# Patient Record
Sex: Male | Born: 1955 | Race: White | Hispanic: No | Marital: Single | Smoking: Never smoker
Health system: Southern US, Community
[De-identification: ages and names within clinical notes are randomized; demographics above are authoritative.]

## PROBLEM LIST (undated history)

## (undated) DIAGNOSIS — I219 Acute myocardial infarction, unspecified: Secondary | ICD-10-CM

## (undated) DIAGNOSIS — I10 Essential (primary) hypertension: Secondary | ICD-10-CM

## (undated) DIAGNOSIS — M1990 Unspecified osteoarthritis, unspecified site: Secondary | ICD-10-CM

## (undated) HISTORY — PX: JOINT REPLACEMENT: SHX530

---

## 2016-06-08 DIAGNOSIS — I219 Acute myocardial infarction, unspecified: Secondary | ICD-10-CM

## 2016-06-08 HISTORY — DX: Acute myocardial infarction, unspecified: I21.9

## 2016-06-11 HISTORY — PX: CARDIAC CATHETERIZATION: SHX172

## 2016-07-09 ENCOUNTER — Inpatient Hospital Stay
Admission: RE | Admit: 2016-07-09 | Discharge: 2016-08-12 | Disposition: A | Discharge status: Skilled Nursing Facility | Payer: Managed Care, Other (non HMO) | Attending: Internal Medicine | Admitting: Internal Medicine

## 2016-07-09 DIAGNOSIS — Z452 Encounter for adjustment and management of vascular access device: Secondary | ICD-10-CM

## 2016-07-09 DIAGNOSIS — N179 Acute kidney failure, unspecified: Secondary | ICD-10-CM

## 2016-07-09 DIAGNOSIS — N189 Chronic kidney disease, unspecified: Principal | ICD-10-CM

## 2016-07-10 LAB — COMPREHENSIVE METABOLIC PANEL
ALT: 17 U/L (ref 17–63)
AST: 30 U/L (ref 15–41)
Albumin: 1.8 g/dL — ABNORMAL LOW (ref 3.5–5.0)
Alkaline Phosphatase: 89 U/L (ref 38–126)
Anion gap: 10 (ref 5–15)
BUN: 57 mg/dL — AB (ref 6–20)
CHLORIDE: 114 mmol/L — AB (ref 101–111)
CO2: 16 mmol/L — AB (ref 22–32)
CREATININE: 3.65 mg/dL — AB (ref 0.61–1.24)
Calcium: 8 mg/dL — ABNORMAL LOW (ref 8.9–10.3)
GFR calc Af Amer: 20 mL/min — ABNORMAL LOW (ref >60)
GFR calc non Af Amer: 17 mL/min — ABNORMAL LOW (ref >60)
GLUCOSE: 108 mg/dL — AB (ref 65–99)
Potassium: 4.6 mmol/L (ref 3.5–5.1)
SODIUM: 140 mmol/L (ref 135–145)
Total Bilirubin: 0.5 mg/dL (ref 0.3–1.2)
Total Protein: 5.8 g/dL — ABNORMAL LOW (ref 6.5–8.1)

## 2016-07-10 LAB — PROTIME-INR
INR: 1.49
Prothrombin Time: 18.1 seconds — ABNORMAL HIGH (ref 11.4–15.2)

## 2016-07-10 LAB — CBC
HCT: 32.2 % — ABNORMAL LOW (ref 39.0–52.0)
Hemoglobin: 9.7 g/dL — ABNORMAL LOW (ref 13.0–17.0)
MCH: 26.9 pg (ref 26.0–34.0)
MCHC: 30.1 g/dL (ref 30.0–36.0)
MCV: 89.4 fL (ref 78.0–100.0)
PLATELETS: 269 10*3/uL (ref 150–400)
RBC: 3.6 MIL/uL — ABNORMAL LOW (ref 4.22–5.81)
RDW: 16.3 % — AB (ref 11.5–15.5)
WBC: 10 10*3/uL (ref 4.0–10.5)

## 2016-07-15 LAB — CBC
HCT: 36.3 % — ABNORMAL LOW (ref 39.0–52.0)
Hemoglobin: 10.9 g/dL — ABNORMAL LOW (ref 13.0–17.0)
MCH: 26.9 pg (ref 26.0–34.0)
MCHC: 30 g/dL (ref 30.0–36.0)
MCV: 89.6 fL (ref 78.0–100.0)
PLATELETS: 227 10*3/uL (ref 150–400)
RBC: 4.05 MIL/uL — ABNORMAL LOW (ref 4.22–5.81)
RDW: 17.6 % — AB (ref 11.5–15.5)
WBC: 8 10*3/uL (ref 4.0–10.5)

## 2016-07-15 LAB — BASIC METABOLIC PANEL
ANION GAP: 14 (ref 5–15)
BUN: 82 mg/dL — ABNORMAL HIGH (ref 6–20)
CALCIUM: 8.4 mg/dL — AB (ref 8.9–10.3)
CO2: 14 mmol/L — ABNORMAL LOW (ref 22–32)
Chloride: 109 mmol/L (ref 101–111)
Creatinine, Ser: 6.15 mg/dL — ABNORMAL HIGH (ref 0.61–1.24)
GFR, EST AFRICAN AMERICAN: 10 mL/min — AB (ref >60)
GFR, EST NON AFRICAN AMERICAN: 9 mL/min — AB (ref >60)
Glucose, Bld: 111 mg/dL — ABNORMAL HIGH (ref 65–99)
Potassium: 6 mmol/L — ABNORMAL HIGH (ref 3.5–5.1)
Sodium: 137 mmol/L (ref 135–145)

## 2016-07-15 LAB — POTASSIUM: Potassium: 5.8 mmol/L — ABNORMAL HIGH (ref 3.5–5.1)

## 2016-07-16 ENCOUNTER — Institutional Professional Consult (permissible substitution) (HOSPITAL_COMMUNITY): Payer: Managed Care, Other (non HMO)

## 2016-07-16 LAB — URINALYSIS, ROUTINE W REFLEX MICROSCOPIC
BILIRUBIN URINE: NEGATIVE
Glucose, UA: NEGATIVE mg/dL
Ketones, ur: NEGATIVE mg/dL
Nitrite: NEGATIVE
PH: 5 (ref 5.0–8.0)
Protein, ur: 100 mg/dL — AB
Specific Gravity, Urine: 1.013 (ref 1.005–1.030)
Squamous Epithelial / LPF: NONE SEEN

## 2016-07-16 LAB — BASIC METABOLIC PANEL
ANION GAP: 9 (ref 5–15)
BUN: 42 mg/dL — ABNORMAL HIGH (ref 6–20)
CO2: 18 mmol/L — ABNORMAL LOW (ref 22–32)
Calcium: 7.9 mg/dL — ABNORMAL LOW (ref 8.9–10.3)
Chloride: 113 mmol/L — ABNORMAL HIGH (ref 101–111)
Creatinine, Ser: 2.07 mg/dL — ABNORMAL HIGH (ref 0.61–1.24)
GFR calc Af Amer: 39 mL/min — ABNORMAL LOW (ref >60)
GFR calc non Af Amer: 34 mL/min — ABNORMAL LOW (ref >60)
Glucose, Bld: 113 mg/dL — ABNORMAL HIGH (ref 65–99)
POTASSIUM: 4.1 mmol/L (ref 3.5–5.1)
SODIUM: 140 mmol/L (ref 135–145)

## 2016-07-16 NOTE — Consult Note (Signed)
[facility] CONSULT NOTE    Date: 07/16/2016                  Patient Name:  Wayne Phelps  MRN: [id number]  DOB: 11-19-1955  Age / Sex: 61 y.o., male         PCP: System, Pcp Not In                 Service Requesting Consult: Hospitalist                 Reason for Consult: Acute renal failure            History of Present Illness: Patient is a 61 y.o. male with a PMHx of morbid obesity,hypertension, and depression, who was admitted to [facility] on 07/09/2016 for ongoing care of generalized weakness, acute renal failure, recent NSTEMI status post PCI 06/13/2016, femoral arteriotomy injury with dehiscence.  Patient was admitted to no one helped from 06/13/2016-07/09/2016.  He originally presented with chest pain. He underwent drug-eluting stent placement to the right coronary artery on 06/13/2016. Subsequently he developed hemorrhagic shock and femoral arteriotomy injury with wound dehiscence. He also had acute hypoxic respiratory failure and was ultimately extubated on 06/25/2016. He then had to be reintubated on 06/29/2016 and was then again next visit on 07/01/2016. During this hospitalization which is likely multifactorial with contributions from hemorrhagic shock and contrastadministration.  Upon presentation here the patient's creatinine was 3.65. This was rechecked on 07/15/16 and BUN is at 82 with a creatinine of 6.15. Foley catheter was subsequently placed n.p.o. And is currently down to 42 with a creatinine of 2.07. Serum potassium also down to 4.1.  Medications: Current medications: Humalog insulin, amiodarone 600 mg daily, aspirin 81 mg daily, atorvastatin 40 mg each bedtime, brilinta 90mg  BID, famotidine 20 mg twice a day, hydralazine 50 mg daily, metoclopramide 5 mg 2 a.c., metoprolol 25 mg twice a day, MiraLax 17 g daily, protein supplement 30 cc twice a day, multivitamin 1 tablet daily   Allergies: No known drug allergies   Past Medical  History: Depression    Edema    Benign essential HTN    Morbid obesity (*)    CAD (coronary artery disease)    Common femoral artery injury       Past Surgical History: left total shoulder     SHOULDER SURGERY  Left   CORONARY ANGIOPLASTY WITH STENT PLACEMENT 06/13/2016    FEMORAL ARTERY REPAIR 06/13/2016 Right Repair CRA Interpositional Graft Dr Carmine Savoy   Medical History    Family History: Patient denies family history of  Social History: Lives alone. Denies tobacco, alcohol, or illicit drug use. Works in [employer], academic.   Review of Systems: As per HPI  Vital Signs: Temperature 90.8 pulse 70 respirations 20 blood pressure 131/75 Weight trends: There were no vitals filed for this visit.  Physical Exam: General: Obese male, no acute distress  Head: Normocephalic, atraumatic.  Eyes: Anicteric, EOMI  Nose: Mucous membranes moist, not inflammed, nonerythematous.  Throat: Oropharynx nonerythematous, no exudate appreciated.   Neck: Supple, trachea midline.  Lungs:  Normal respiratory effort. Clear to auscultation BL without crackles or wheezes.  Heart: S1S2 no rubs, irregular at times  Abdomen:  BS normoactive. Soft, Nondistended, non-tender.  No masses or organomegaly.  Extremities: 1+ b/l LE edema  Neurologic: A&O X3, Motor strength is 5/5 in the all 4 extremities  Skin: No visible rashes, scars.    Lab results: Basic Metabolic Panel:  Recent Labs Lab 07/10/16 0551 07/15/16 0623 07/15/16 1408 07/16/16 1225  NA 140 137  --  140  K 4.6 6.0* 5.8* 4.1  CL 114* 109  --  113*  CO2 16* 14*  --  18*  GLUCOSE 108* 111*  --  113*  BUN 57* 82*  --  42*  CREATININE 3.65* 6.15*  --  2.07*  CALCIUM 8.0* 8.4*  --  7.9*    Liver Function Tests:  Recent Labs Lab 07/10/16 0551  AST 30  ALT 17  ALKPHOS 89  BILITOT 0.5  PROT 5.8*  ALBUMIN 1.8*   No results for input(s): LIPASE, AMYLASE in the last 168 hours. No results for input(s): AMMONIA  in the last 168 hours.  CBC:  Recent Labs Lab 07/10/16 0551 07/15/16 0623  WBC 10.0 8.0  HGB 9.7* 10.9*  HCT 32.2* 36.3*  MCV 89.4 89.6  PLT 269 227    Cardiac Enzymes: No results for input(s): CKTOTAL, CKMB, CKMBINDEX, TROPONINI in the last 168 hours.  BNP: Invalid input(s): POCBNP  CBG: No results for input(s): GLUCAP in the last 168 hours.  Microbiology: No results found for this or any previous visit.  Coagulation Studies: No results for input(s): LABPROT, INR in the last 72 hours.  Urinalysis: No results for input(s): COLORURINE, LABSPEC, PHURINE, GLUCOSEU, HGBUR, BILIRUBINUR, KETONESUR, PROTEINUR, UROBILINOGEN, NITRITE, LEUKOCYTESUR in the last 72 hours.  Invalid input(s): APPERANCEUR    Imaging:  No results found.   Assessment & Plan: Pt is a 61 y.o. male with a PMHx of morbid obesity,hypertension, depression, who was admitted to [facility] on 07/09/2016 for ongoing care of generalized weakness, acute renal failure, recent NSTEMI status post PCI 06/13/2016 s/p RCA stent placement, femoral arteriotomy injury with dehiscence.   1.  Acute renal failure. 2.  Urinary retention. 3.  Hyperkalemia, K down to 4.1. 4.  Metabolic acidosis.  Plan:  He patient had recent acute renal failure noted at [facility].  Yesterday the patient's creatinine was noted to be markedly elevated at 6.15 with a potassium of 6.0. Foley catheter was placed and he had 3 L of urine output in a fairly short period of time. Therefore it appears he has some element of urinary retention. We will obtain renal ultrasound to further evaluate. Recommend keeping the Foley catheter in place for now. BUN currently down to 42 and creatinine is down to 2.07. Potassium has improved to 4.1. Otherwise recommend avoiding contrast, NSAIDs, and nephrotoxins at this time. Thanks for the consultation.

## 2016-07-17 LAB — RENAL FUNCTION PANEL
ALBUMIN: 2.1 g/dL — AB (ref 3.5–5.0)
Anion gap: 8 (ref 5–15)
BUN: 30 mg/dL — AB (ref 6–20)
CALCIUM: 8.1 mg/dL — AB (ref 8.9–10.3)
CO2: 21 mmol/L — AB (ref 22–32)
CREATININE: 1.35 mg/dL — AB (ref 0.61–1.24)
Chloride: 112 mmol/L — ABNORMAL HIGH (ref 101–111)
GFR calc Af Amer: >60 mL/min (ref >60)
GFR calc non Af Amer: 56 mL/min — ABNORMAL LOW (ref >60)
GLUCOSE: 102 mg/dL — AB (ref 65–99)
PHOSPHORUS: 2.7 mg/dL (ref 2.5–4.6)
Potassium: 4.1 mmol/L (ref 3.5–5.1)
SODIUM: 141 mmol/L (ref 135–145)

## 2016-07-18 NOTE — Progress Notes (Signed)
[facility]  ROUNDING NOTE   Subjective:  Patient doing much better. BUN down to 30 and creatinine of 1.3. Kidney function significantly improved with Foley drainage.   Objective:  Vital signs in last 24 hours:  Temperature 98.4 pulse 67 respirations 19 blood pressure 141/63 Physical Exam: General: No acute distress, morbidly obese  Head: Normocephalic, atraumatic. Moist oral mucosal membranes  Eyes: Anicteric  Neck: Supple, trachea midline  Lungs:  Clear to auscultation, normal effort  Heart: S1S2 no rubs  Abdomen:  Soft, nontender, bowel sounds present  Extremities: Trace peripheral edema.  Neurologic: Awake, alert, following commands  Skin: No lesions       Basic Metabolic Panel:  Recent Labs Lab 07/15/16 0623 07/15/16 1408 07/16/16 1225 07/17/16 0458  NA 137  --  140 141  K 6.0* 5.8* 4.1 4.1  CL 109  --  113* 112*  CO2 14*  --  18* 21*  GLUCOSE 111*  --  113* 102*  BUN 82*  --  42* 30*  CREATININE 6.15*  --  2.07* 1.35*  CALCIUM 8.4*  --  7.9* 8.1*  PHOS  --   --   --  2.7    Liver Function Tests:  Recent Labs Lab 07/17/16 0458  ALBUMIN 2.1*   No results for input(s): LIPASE, AMYLASE in the last 168 hours. No results for input(s): AMMONIA in the last 168 hours.  CBC:  Recent Labs Lab 07/15/16 0623  WBC 8.0  HGB 10.9*  HCT 36.3*  MCV 89.6  PLT 227    Cardiac Enzymes: No results for input(s): CKTOTAL, CKMB, CKMBINDEX, TROPONINI in the last 168 hours.  BNP: Invalid input(s): POCBNP  CBG: No results for input(s): GLUCAP in the last 168 hours.  Microbiology: No results found for this or any previous visit.  Coagulation Studies: No results for input(s): LABPROT, INR in the last 72 hours.  Urinalysis:  Recent Labs  07/16/16 1512  COLORURINE YELLOW  LABSPEC 1.013  PHURINE 5.0  GLUCOSEU NEGATIVE  HGBUR LARGE*  BILIRUBINUR NEGATIVE  KETONESUR NEGATIVE  PROTEINUR 100*  NITRITE NEGATIVE  LEUKOCYTESUR LARGE*       Imaging: US Renal  Result Date: 07/16/2016 CLINICAL DATA:  Acute onset of renal failure.  Initial encounter. EXAM: RENAL / URINARY TRACT ULTRASOUND COMPLETE COMPARISON:  None. FINDINGS: Right Kidney: Length: 11.6 cm. Echogenicity within normal limits. No mass or hydronephrosis visualized. Left Kidney: Length: 12.5 cm. Echogenicity within normal limits. No mass or hydronephrosis visualized. Bladder: Decompressed and not visualized on this study. IMPRESSION: Unremarkable renal ultrasound. No evidence of hydronephrosis. Bladder not visualized. Electronically Signed   By: Garald Balding M.D.   On: 07/16/2016 21:10     Medications:       Assessment/ Plan:  61 y.o. male with a PMHx of morbid obesity,hypertension, depression, who was admitted to [facility] on 07/09/2016 for ongoing care of generalized weakness, acute renal failure, recent NSTEMI status post PCI 06/13/2016 s/p RCA stent placement, femoral arteriotomy injury with dehiscence.   1.  Acute renal failure. 2.  Urinary retention. 3.  Hyperkalemia, K down to 4.1. 4.  Metabolic acidosis.  Plan:  Renal function significantly improved. BUN down to 30 and creatinine down to 1.3 with Foley drainage. Patient did haveurinary retention which could have been related to constipation. Renal ultrasound was negative for hydronephrosis. Serum potassium also significantly improved and stable at 4.1. Metabolic acidosis also improved with serum bicarbonate of 21. Recommend continue periodic monitoring of renal function.  LOS: 0 Wayne Phelps 5/11/20188:14 AM

## 2016-07-21 LAB — CBC
HEMATOCRIT: 32.4 % — AB (ref 39.0–52.0)
Hemoglobin: 10 g/dL — ABNORMAL LOW (ref 13.0–17.0)
MCH: 27.8 pg (ref 26.0–34.0)
MCHC: 30.9 g/dL (ref 30.0–36.0)
MCV: 90 fL (ref 78.0–100.0)
Platelets: 181 10*3/uL (ref 150–400)
RBC: 3.6 MIL/uL — AB (ref 4.22–5.81)
RDW: 17.2 % — ABNORMAL HIGH (ref 11.5–15.5)
WBC: 8.5 10*3/uL (ref 4.0–10.5)

## 2016-07-21 LAB — BASIC METABOLIC PANEL
ANION GAP: 10 (ref 5–15)
BUN: 16 mg/dL (ref 6–20)
CHLORIDE: 105 mmol/L (ref 101–111)
CO2: 22 mmol/L (ref 22–32)
Calcium: 8.5 mg/dL — ABNORMAL LOW (ref 8.9–10.3)
Creatinine, Ser: 0.89 mg/dL (ref 0.61–1.24)
GFR calc Af Amer: >60 mL/min (ref >60)
GFR calc non Af Amer: >60 mL/min (ref >60)
GLUCOSE: 107 mg/dL — AB (ref 65–99)
POTASSIUM: 3.3 mmol/L — AB (ref 3.5–5.1)
Sodium: 137 mmol/L (ref 135–145)

## 2016-07-22 LAB — POTASSIUM: POTASSIUM: 3.2 mmol/L — AB (ref 3.5–5.1)

## 2016-07-23 LAB — BASIC METABOLIC PANEL
Anion gap: 9 (ref 5–15)
BUN: 20 mg/dL (ref 6–20)
CHLORIDE: 104 mmol/L (ref 101–111)
CO2: 26 mmol/L (ref 22–32)
CREATININE: 1 mg/dL (ref 0.61–1.24)
Calcium: 8.5 mg/dL — ABNORMAL LOW (ref 8.9–10.3)
GFR calc Af Amer: >60 mL/min (ref >60)
GFR calc non Af Amer: >60 mL/min (ref >60)
Glucose, Bld: 113 mg/dL — ABNORMAL HIGH (ref 65–99)
Potassium: 3.5 mmol/L (ref 3.5–5.1)
Sodium: 139 mmol/L (ref 135–145)

## 2016-07-28 LAB — BASIC METABOLIC PANEL
ANION GAP: 11 (ref 5–15)
Anion gap: 11 (ref 5–15)
BUN: 20 mg/dL (ref 6–20)
BUN: 21 mg/dL — AB (ref 6–20)
CHLORIDE: 102 mmol/L (ref 101–111)
CO2: 22 mmol/L (ref 22–32)
CO2: 22 mmol/L (ref 22–32)
CREATININE: 1.25 mg/dL — AB (ref 0.61–1.24)
Calcium: 8.3 mg/dL — ABNORMAL LOW (ref 8.9–10.3)
Calcium: 8.3 mg/dL — ABNORMAL LOW (ref 8.9–10.3)
Chloride: 104 mmol/L (ref 101–111)
Creatinine, Ser: 1.41 mg/dL — ABNORMAL HIGH (ref 0.61–1.24)
GFR calc Af Amer: >60 mL/min (ref >60)
GFR calc Af Amer: >60 mL/min (ref >60)
GFR, EST NON AFRICAN AMERICAN: 53 mL/min — AB (ref >60)
GLUCOSE: 109 mg/dL — AB (ref 65–99)
Glucose, Bld: 103 mg/dL — ABNORMAL HIGH (ref 65–99)
POTASSIUM: 3.8 mmol/L (ref 3.5–5.1)
Potassium: 3.8 mmol/L (ref 3.5–5.1)
SODIUM: 137 mmol/L (ref 135–145)
Sodium: 135 mmol/L (ref 135–145)

## 2016-07-28 LAB — CBC
HCT: 32.5 % — ABNORMAL LOW (ref 39.0–52.0)
HCT: 33.8 % — ABNORMAL LOW (ref 39.0–52.0)
HEMOGLOBIN: 10.3 g/dL — AB (ref 13.0–17.0)
Hemoglobin: 9.8 g/dL — ABNORMAL LOW (ref 13.0–17.0)
MCH: 26.3 pg (ref 26.0–34.0)
MCH: 26.5 pg (ref 26.0–34.0)
MCHC: 30.2 g/dL (ref 30.0–36.0)
MCHC: 30.5 g/dL (ref 30.0–36.0)
MCV: 87.1 fL (ref 78.0–100.0)
MCV: 86.9 fL (ref 78.0–100.0)
PLATELETS: 217 10*3/uL (ref 150–400)
PLATELETS: 218 10*3/uL (ref 150–400)
RBC: 3.73 MIL/uL — ABNORMAL LOW (ref 4.22–5.81)
RBC: 3.89 MIL/uL — AB (ref 4.22–5.81)
RDW: 16.3 % — AB (ref 11.5–15.5)
RDW: 16.3 % — ABNORMAL HIGH (ref 11.5–15.5)
WBC: 7.1 10*3/uL (ref 4.0–10.5)
WBC: 8.1 10*3/uL (ref 4.0–10.5)

## 2016-07-28 LAB — PROTIME-INR
INR: 1.2
Prothrombin Time: 15.2 seconds (ref 11.4–15.2)

## 2016-07-28 LAB — MAGNESIUM: MAGNESIUM: 1.8 mg/dL (ref 1.7–2.4)

## 2016-07-28 LAB — C DIFFICILE QUICK SCREEN W PCR REFLEX
C DIFFICLE (CDIFF) ANTIGEN: POSITIVE — AB
C Diff interpretation: DETECTED
C Diff toxin: POSITIVE — AB

## 2016-07-30 LAB — BASIC METABOLIC PANEL
Anion gap: 10 (ref 5–15)
BUN: 21 mg/dL — AB (ref 6–20)
CHLORIDE: 104 mmol/L (ref 101–111)
CO2: 20 mmol/L — AB (ref 22–32)
CREATININE: 1.58 mg/dL — AB (ref 0.61–1.24)
Calcium: 8.1 mg/dL — ABNORMAL LOW (ref 8.9–10.3)
GFR calc Af Amer: 54 mL/min — ABNORMAL LOW (ref >60)
GFR, EST NON AFRICAN AMERICAN: 46 mL/min — AB (ref >60)
GLUCOSE: 113 mg/dL — AB (ref 65–99)
POTASSIUM: 3.5 mmol/L (ref 3.5–5.1)
Sodium: 134 mmol/L — ABNORMAL LOW (ref 135–145)

## 2016-08-01 LAB — BASIC METABOLIC PANEL
Anion gap: 10 (ref 5–15)
BUN: 24 mg/dL — ABNORMAL HIGH (ref 6–20)
CALCIUM: 8.1 mg/dL — AB (ref 8.9–10.3)
CHLORIDE: 104 mmol/L (ref 101–111)
CO2: 22 mmol/L (ref 22–32)
CREATININE: 1.59 mg/dL — AB (ref 0.61–1.24)
GFR calc non Af Amer: 46 mL/min — ABNORMAL LOW (ref >60)
GFR, EST AFRICAN AMERICAN: 53 mL/min — AB (ref >60)
Glucose, Bld: 106 mg/dL — ABNORMAL HIGH (ref 65–99)
Potassium: 3.4 mmol/L — ABNORMAL LOW (ref 3.5–5.1)
SODIUM: 136 mmol/L (ref 135–145)

## 2016-08-01 LAB — CBC
HCT: 27.8 % — ABNORMAL LOW (ref 39.0–52.0)
Hemoglobin: 8.6 g/dL — ABNORMAL LOW (ref 13.0–17.0)
MCH: 26.3 pg (ref 26.0–34.0)
MCHC: 30.9 g/dL (ref 30.0–36.0)
MCV: 85 fL (ref 78.0–100.0)
PLATELETS: 275 10*3/uL (ref 150–400)
RBC: 3.27 MIL/uL — ABNORMAL LOW (ref 4.22–5.81)
RDW: 16.8 % — AB (ref 11.5–15.5)
WBC: 11.3 10*3/uL — ABNORMAL HIGH (ref 4.0–10.5)

## 2016-08-02 LAB — POTASSIUM: POTASSIUM: 3.6 mmol/L (ref 3.5–5.1)

## 2016-08-03 LAB — BASIC METABOLIC PANEL
Anion gap: 10 (ref 5–15)
BUN: 31 mg/dL — AB (ref 6–20)
CALCIUM: 7.9 mg/dL — AB (ref 8.9–10.3)
CO2: 20 mmol/L — ABNORMAL LOW (ref 22–32)
CREATININE: 1.86 mg/dL — AB (ref 0.61–1.24)
Chloride: 105 mmol/L (ref 101–111)
GFR calc Af Amer: 44 mL/min — ABNORMAL LOW (ref >60)
GFR calc non Af Amer: 38 mL/min — ABNORMAL LOW (ref >60)
GLUCOSE: 100 mg/dL — AB (ref 65–99)
Potassium: 3.7 mmol/L (ref 3.5–5.1)
Sodium: 135 mmol/L (ref 135–145)

## 2016-08-03 LAB — CBC WITH DIFFERENTIAL/PLATELET
BASOS PCT: 0 %
Basophils Absolute: 0 10*3/uL (ref 0.0–0.1)
EOS ABS: 0.2 10*3/uL (ref 0.0–0.7)
Eosinophils Relative: 2 %
HCT: 27.2 % — ABNORMAL LOW (ref 39.0–52.0)
Hemoglobin: 8.4 g/dL — ABNORMAL LOW (ref 13.0–17.0)
LYMPHS ABS: 0.9 10*3/uL (ref 0.7–4.0)
LYMPHS PCT: 8 %
MCH: 26.5 pg (ref 26.0–34.0)
MCHC: 30.9 g/dL (ref 30.0–36.0)
MCV: 85.8 fL (ref 78.0–100.0)
MONO ABS: 0.6 10*3/uL (ref 0.1–1.0)
Monocytes Relative: 5 %
NEUTROS ABS: 9.5 10*3/uL — AB (ref 1.7–7.7)
Neutrophils Relative %: 85 %
Platelets: 320 10*3/uL (ref 150–400)
RBC: 3.17 MIL/uL — ABNORMAL LOW (ref 4.22–5.81)
RDW: 17.4 % — ABNORMAL HIGH (ref 11.5–15.5)
WBC: 11.2 10*3/uL — ABNORMAL HIGH (ref 4.0–10.5)

## 2016-08-03 LAB — URINALYSIS, ROUTINE W REFLEX MICROSCOPIC
GLUCOSE, UA: 100 mg/dL — AB
Ketones, ur: 15 mg/dL — AB
Nitrite: POSITIVE — AB
SPECIFIC GRAVITY, URINE: 1.01 (ref 1.005–1.030)
pH: 7 (ref 5.0–8.0)

## 2016-08-03 LAB — URINALYSIS, MICROSCOPIC (REFLEX): SQUAMOUS EPITHELIAL / LPF: NONE SEEN

## 2016-08-05 ENCOUNTER — Other Ambulatory Visit (HOSPITAL_COMMUNITY): Payer: Managed Care, Other (non HMO)

## 2016-08-05 LAB — URINE CULTURE: Culture: >=100000 — AB

## 2016-08-07 LAB — BASIC METABOLIC PANEL
Anion gap: 7 (ref 5–15)
BUN: 35 mg/dL — AB (ref 6–20)
CHLORIDE: 105 mmol/L (ref 101–111)
CO2: 25 mmol/L (ref 22–32)
CREATININE: 1.9 mg/dL — AB (ref 0.61–1.24)
Calcium: 8 mg/dL — ABNORMAL LOW (ref 8.9–10.3)
GFR calc non Af Amer: 37 mL/min — ABNORMAL LOW (ref >60)
GFR, EST AFRICAN AMERICAN: 43 mL/min — AB (ref >60)
GLUCOSE: 104 mg/dL — AB (ref 65–99)
POTASSIUM: 4.1 mmol/L (ref 3.5–5.1)
Sodium: 137 mmol/L (ref 135–145)

## 2016-08-10 LAB — BASIC METABOLIC PANEL
ANION GAP: 7 (ref 5–15)
BUN: 30 mg/dL — ABNORMAL HIGH (ref 6–20)
CALCIUM: 8.4 mg/dL — AB (ref 8.9–10.3)
CO2: 26 mmol/L (ref 22–32)
CREATININE: 1.38 mg/dL — AB (ref 0.61–1.24)
Chloride: 104 mmol/L (ref 101–111)
GFR calc non Af Amer: 54 mL/min — ABNORMAL LOW (ref >60)
Glucose, Bld: 104 mg/dL — ABNORMAL HIGH (ref 65–99)
Potassium: 4.3 mmol/L (ref 3.5–5.1)
SODIUM: 137 mmol/L (ref 135–145)

## 2016-09-03 ENCOUNTER — Inpatient Hospital Stay (HOSPITAL_COMMUNITY)
Admission: EM | Admit: 2016-09-03 | Discharge: 2016-09-09 | DRG: 378 | Disposition: A | Discharge status: Home / Self Care | Payer: Managed Care, Other (non HMO) | Attending: Internal Medicine | Admitting: Internal Medicine

## 2016-09-03 DIAGNOSIS — R339 Retention of urine, unspecified: Secondary | ICD-10-CM | POA: Diagnosis not present

## 2016-09-03 DIAGNOSIS — R710 Precipitous drop in hematocrit: Secondary | ICD-10-CM

## 2016-09-03 DIAGNOSIS — Z96612 Presence of left artificial shoulder joint: Secondary | ICD-10-CM | POA: Diagnosis present

## 2016-09-03 DIAGNOSIS — Z951 Presence of aortocoronary bypass graft: Secondary | ICD-10-CM

## 2016-09-03 DIAGNOSIS — K625 Hemorrhage of anus and rectum: Secondary | ICD-10-CM | POA: Diagnosis not present

## 2016-09-03 DIAGNOSIS — I252 Old myocardial infarction: Secondary | ICD-10-CM

## 2016-09-03 DIAGNOSIS — D649 Anemia, unspecified: Secondary | ICD-10-CM | POA: Diagnosis present

## 2016-09-03 DIAGNOSIS — K921 Melena: Secondary | ICD-10-CM | POA: Diagnosis not present

## 2016-09-03 DIAGNOSIS — F329 Major depressive disorder, single episode, unspecified: Secondary | ICD-10-CM | POA: Diagnosis present

## 2016-09-03 DIAGNOSIS — Z960 Presence of urogenital implants: Secondary | ICD-10-CM | POA: Diagnosis present

## 2016-09-03 DIAGNOSIS — Z7902 Long term (current) use of antithrombotics/antiplatelets: Secondary | ICD-10-CM

## 2016-09-03 DIAGNOSIS — T45525A Adverse effect of antithrombotic drugs, initial encounter: Secondary | ICD-10-CM | POA: Diagnosis present

## 2016-09-03 DIAGNOSIS — I10 Essential (primary) hypertension: Secondary | ICD-10-CM | POA: Diagnosis present

## 2016-09-03 DIAGNOSIS — E785 Hyperlipidemia, unspecified: Secondary | ICD-10-CM | POA: Diagnosis present

## 2016-09-03 DIAGNOSIS — Z6841 Body Mass Index (BMI) 40.0 and over, adult: Secondary | ICD-10-CM

## 2016-09-03 DIAGNOSIS — Z7982 Long term (current) use of aspirin: Secondary | ICD-10-CM

## 2016-09-03 DIAGNOSIS — D125 Benign neoplasm of sigmoid colon: Secondary | ICD-10-CM

## 2016-09-03 DIAGNOSIS — K5730 Diverticulosis of large intestine without perforation or abscess without bleeding: Secondary | ICD-10-CM

## 2016-09-03 DIAGNOSIS — K649 Unspecified hemorrhoids: Secondary | ICD-10-CM

## 2016-09-03 DIAGNOSIS — R195 Other fecal abnormalities: Secondary | ICD-10-CM

## 2016-09-03 DIAGNOSIS — Z955 Presence of coronary angioplasty implant and graft: Secondary | ICD-10-CM

## 2016-09-03 DIAGNOSIS — Z79899 Other long term (current) drug therapy: Secondary | ICD-10-CM

## 2016-09-03 HISTORY — DX: Unspecified osteoarthritis, unspecified site: M19.90

## 2016-09-03 HISTORY — DX: Acute myocardial infarction, unspecified: I21.9

## 2016-09-03 HISTORY — DX: Essential (primary) hypertension: I10

## 2016-09-03 LAB — CBC WITH DIFFERENTIAL/PLATELET
Basophils Absolute: 0 10*3/uL (ref 0.0–0.1)
Basophils Relative: 0 %
EOS PCT: 7 %
Eosinophils Absolute: 0.4 10*3/uL (ref 0.0–0.7)
HCT: 32.5 % — ABNORMAL LOW (ref 39.0–52.0)
Hemoglobin: 10 g/dL — ABNORMAL LOW (ref 13.0–17.0)
LYMPHS ABS: 1.3 10*3/uL (ref 0.7–4.0)
LYMPHS PCT: 19 %
MCH: 26.1 pg (ref 26.0–34.0)
MCHC: 30.8 g/dL (ref 30.0–36.0)
MCV: 84.9 fL (ref 78.0–100.0)
MONO ABS: 0.5 10*3/uL (ref 0.1–1.0)
Monocytes Relative: 8 %
Neutro Abs: 4.5 10*3/uL (ref 1.7–7.7)
Neutrophils Relative %: 66 %
PLATELETS: 154 10*3/uL (ref 150–400)
RBC: 3.83 MIL/uL — ABNORMAL LOW (ref 4.22–5.81)
RDW: 17.5 % — AB (ref 11.5–15.5)
WBC: 6.7 10*3/uL (ref 4.0–10.5)

## 2016-09-03 LAB — BASIC METABOLIC PANEL
Anion gap: 11 (ref 5–15)
BUN: 8 mg/dL (ref 6–20)
CHLORIDE: 106 mmol/L (ref 101–111)
CO2: 22 mmol/L (ref 22–32)
CREATININE: 0.98 mg/dL (ref 0.61–1.24)
Calcium: 9 mg/dL (ref 8.9–10.3)
GFR calc Af Amer: >60 mL/min (ref >60)
GFR calc non Af Amer: >60 mL/min (ref >60)
GLUCOSE: 101 mg/dL — AB (ref 65–99)
POTASSIUM: 3.5 mmol/L (ref 3.5–5.1)
Sodium: 139 mmol/L (ref 135–145)

## 2016-09-03 LAB — TYPE AND SCREEN
ABO/RH(D): A POS
ANTIBODY SCREEN: NEGATIVE

## 2016-09-03 LAB — CBC
HCT: 30.9 % — ABNORMAL LOW (ref 39.0–52.0)
Hemoglobin: 9.6 g/dL — ABNORMAL LOW (ref 13.0–17.0)
MCH: 26.2 pg (ref 26.0–34.0)
MCHC: 31.1 g/dL (ref 30.0–36.0)
MCV: 84.2 fL (ref 78.0–100.0)
PLATELETS: 190 10*3/uL (ref 150–400)
RBC: 3.67 MIL/uL — ABNORMAL LOW (ref 4.22–5.81)
RDW: 17.5 % — AB (ref 11.5–15.5)
WBC: 7 10*3/uL (ref 4.0–10.5)

## 2016-09-03 LAB — POC OCCULT BLOOD, ED: Fecal Occult Bld: POSITIVE — AB

## 2016-09-03 LAB — ABO/RH: ABO/RH(D): A POS

## 2016-09-03 LAB — PROTIME-INR
INR: 1.06
Prothrombin Time: 13.9 seconds (ref 11.4–15.2)

## 2016-09-03 MED ORDER — DICLOFENAC SODIUM 1 % TD GEL
2.0000 g | Freq: Three times a day (TID) | TRANSDERMAL | Status: DC
  Administered 2016-09-03 – 2016-09-09 (×11): 2 g via TOPICAL
  Filled 2016-09-03: qty 100

## 2016-09-03 MED ORDER — AMIODARONE HCL 100 MG PO TABS
100.0000 mg | ORAL_TABLET | Freq: Every day | ORAL | Status: DC
  Administered 2016-09-04 – 2016-09-09 (×6): 100 mg via ORAL
  Filled 2016-09-03 (×6): qty 1

## 2016-09-03 MED ORDER — PANTOPRAZOLE SODIUM 40 MG IV SOLR
40.0000 mg | Freq: Two times a day (BID) | INTRAVENOUS | Status: DC
  Administered 2016-09-03 (×2): 40 mg via INTRAVENOUS
  Filled 2016-09-03 (×2): qty 40

## 2016-09-03 MED ORDER — ATORVASTATIN CALCIUM 40 MG PO TABS
40.0000 mg | ORAL_TABLET | Freq: Every day | ORAL | Status: DC
  Administered 2016-09-03 – 2016-09-08 (×6): 40 mg via ORAL
  Filled 2016-09-03 (×6): qty 1

## 2016-09-03 MED ORDER — AMIODARONE HCL 200 MG PO TABS: 100.0000 mg | ORAL_TABLET | Freq: Every day | ORAL | Status: DC

## 2016-09-03 NOTE — ED Notes (Signed)
Got patient on the monitor undress into a gown patient is resting

## 2016-09-03 NOTE — ED Notes (Signed)
Pt repositioned in the bed with staff assistance. Pt unable to assist

## 2016-09-03 NOTE — H&P (Signed)
Date: 09/03/2016               Patient Name:  Wayne Phelps MRN: [id number]  DOB: 1955-05-25 Age / Sex: 61 y.o., male   PCP: System, Pcp Not In         Medical Service: Internal Medicine Teaching Service         Attending Physician: Dr. Beryle Beams, Alyson Locket, MD    First Contact: Dr. Heber Granite Pager: [phone]  Second Contact: Dr. Posey Pronto  Pager: [phone]       After Hours (After 5p/  First Contact Pager: [phone]  weekends / holidays): Second Contact Pager: [phone]   Chief Complaint: Rectal bleeding  History of Present Illness: Mr. Wayne Phelps is a 61 year old gentleman with history of NSTEMI with DES to RCA in [id number]/5463 complicated by femoral arteriotomy injury and is currently on dual antiplatelet therapy, also has a history of Clostridium difficile that presents to the ED for rectal bleeding.  Patient reports one bowel movement of loose stool with dark red blood this morning.  Patient reports a history of bleeding per rectum and was told he had hemorrhoids in the past.  He denies ever having endoscopy or colonoscopy.  He has associated symptoms of one loose stool per day for the past week.  He denies fever/chills, nausea/vomiting, chest pain, shortness of breath, abdominal pain, or leg swelling.  He reports ibuprofen use daily, denies smoking and alcohol use.  He was recently in the hospital on 06/2016 for left heart cath with stent placement that was complicated by femoral injury with massive blood loss that required ICU admission.  Patient developed Clostridium difficile in 07/2016 and was treated with vancomycin and states he finished the course which he thinks was about 2 weeks. He states that his symptoms resolved with treatment.    Meds:  No outpatient prescriptions have been marked as taking for the 09/03/16 encounter [facility] Encounter).     Allergies: Allergies as of 09/03/2016  . (No Known Allergies)   No past medical history on file.  Family History: Father: bypass  surgery   Social History: Tobacco use: denies Alcohol use: Denies Illicit drug use: Denies   Review of Systems: A complete ROS was negative except as per HPI.  Physical Exam: Blood pressure (!) 163/78, pulse 69, temperature 97.9 F (36.6 C), temperature source Oral, resp. rate (!) 22, SpO2 100 %. Physical Exam Vitals:   09/03/16 1430 09/03/16 1500 09/03/16 1530 09/03/16 1603  BP: (!) 162/79 (!) 165/94 (!) 155/77 (!) 163/78  Pulse: 60 62 63 69  Resp: 15 20 (!) 21 (!) 22  Temp:      TempSrc:      SpO2: 99% 99% 100%    General: Vital signs reviewed.  Patient is well-developed and well-nourished, in no acute distress and cooperative with exam.  Head: Normocephalic and atraumatic. Eyes: EOMI, conjunctivae normal, no scleral icterus.  Neck: Supple, trachea midline, normal ROM,  Cardiovascular: RRR, S1 normal, S2 normal, no murmurs, gallops, or rubs. Pulmonary/Chest: Clear to auscultation bilaterally, no wheezes, rales, or rhonchi. Abdominal: Soft, non-tender, non-distended, BS +, no masses, organomegaly, or guarding present.  Musculoskeletal: No joint deformities, erythema, or stiffness, ROM full and nontender. Extremities: No lower extremity edema bilaterally,  pulses symmetric and intact bilaterally. Venous stasis dermatitis Neurological: A&O x3, Strength is normal and symmetric bilaterally Skin: Warm, dry and intact.  right groin wound without signs of infection and clean dressing in place.  Psychiatric: Normal mood  and affect. speech and behavior is normal. Cognition and memory are normal.    EKG: normal sinus rhythm  CBC    Component Value Date/Time   WBC 6.7 09/03/2016 1325   RBC 3.83 (L) 09/03/2016 1325   HGB 10.0 (L) 09/03/2016 1325   HCT 32.5 (L) 09/03/2016 1325   PLT 154 09/03/2016 1325   MCV 84.9 09/03/2016 1325   MCH 26.1 09/03/2016 1325   MCHC 30.8 09/03/2016 1325   RDW 17.5 (H) 09/03/2016 1325   LYMPHSABS 1.3 09/03/2016 1325   MONOABS 0.5 09/03/2016 1325    EOSABS 0.4 09/03/2016 1325   BASOSABS 0.0 09/03/2016 1325   BMET    Component Value Date/Time   NA 139 09/03/2016 1325   K 3.5 09/03/2016 1325   CL 106 09/03/2016 1325   CO2 22 09/03/2016 1325   GLUCOSE 101 (H) 09/03/2016 1325   BUN 8 09/03/2016 1325   CREATININE 0.98 09/03/2016 1325   CALCIUM 9.0 09/03/2016 1325   GFRNONAA >60 09/03/2016 1325   GFRAA >60 09/03/2016 1325    Assessment & Plan by Problem:  Rectal bleeding Patient presents to the ED after having a bowel movement with red blood. He reports only having 1 bowel movement today.  Hemoglobin was 10 on admission.  He reports taking ibuprofen twice a day, denies alcohol use or history of smoking.  In the ED provider reports frank red blood per rectum and FOBT is positive.  Patient has been taking brilinta and aspirin after RCA stent placement, last dose taken this morning.  Discussed case with Gastroenterology and will assess patient tomorrow.  Will hold brilinta and aspirin and repeat CBC in the morning. - Appreciate GI recommendations - holding brilinta and aspirin - CBC  History of NSTEMI Patient had DES placement to RCA 06/2016.  He is currently on brilinta and aspirin -holding brilinta and aspirin  History of Clostridium difficile  07/28/2016 patient had positive c.diff antigen.  He reports taking vancomycin for about 2 weeks.  He states that he would have watery stools several times a day when he developed c.diff and the symptoms resolved after antibiotic course.       Right femoral wound During left heart cath on 06/2015 patient's right femoral artery was injured. Patient had vascular surgery and has had ongoing wound care at the site.  - wound care   Urinary retention  Patient's recent hospitalization was complicated by urinary retention.  He has a Foley catheter in place and has followed with urology.  He was told to keep the Foley catheter in place until he is able to ambulate. He has not been ambulating due to  right groin wound.  He has follow-up on July 9th with urology. - follow up with urology outpatient    Dispo: Admit patient to Observation with expected length of stay less than 2 midnights.  Signed: Valinda Party, DO 09/03/2016, 4:42 PM  Pager: [phone]

## 2016-09-03 NOTE — ED Notes (Signed)
Patient moved onto Bariatric bed for comfort.

## 2016-09-03 NOTE — Consult Note (Signed)
[facility] Consult: 8:22 AM 09/04/2016  LOS: 0 days    Referring Provider: Kalman Shan M.D., resident.  Primary Care Physician:  [place] in [place], [place] Rebollar PA-C Primary Gastroenterologist:  Althia Forts  Pt's personal Cell # is [phone].     Reason for Consultation: maroon stool   HPI: Wayne Phelps is a 61 y.o. male.  PMH HTN.  Major depression.  Morbid obesity, 400 #, BMI 57.  NSTEMI with admission 06/13/2016 to [facility] in [place].  Underwent drug-eluting stent to RCA 0/14, complicated by femoral artery injury.  Required right common femoral artery repair on 4 6 with subsequent wound to provide mint, wound VAC placement on at least 6 separate occasions, last occurred on 07/08/16. He is on dual antiplatelet therapy.  With these complications he developed hemorrhagic shock and required repeated intubations and ventilator support.   Treated for transfusion requiring anemia. Amiodarone for nonsustained V. tach.  Foley (remains in place) for inability to urinate.  He was discharged 07/09/16 to [facility] and 3 weeks ago discharged to [facility].  Treated with 10 to 14 days oral Vanc [facility] C diff [facility].  404# at discharge from [place]. .    For nearly 1 week, stools looser but brown, no recheck for C diff as far as he is aware.  Yesterday AM had single maroon stool.  1 last night, not described.  1 this AM brown, liquid.   No abd pain.  No N/V.  No anorexia.  No dysphagia, passed FEES swallow eval in late 06/2016.  Infrequent pyrosis.  For 5 years ago he had limited blood per rectum which was attributed to hemorrhoids. No previous EGD or colonoscopy.  Unaware of any liver issues but never went to MD much.   CT abdomen, stone protocol, 07/03/16: Possible  small subcentimeter hypodense lesion in the inferior right hepatic lobe, not well characterized without contrast. The gallbladder, pancreas, spleen, and adrenal glands have an unremarkable noncontrast appearance.  Arrived in the emergency room after transport from rehab facilityon 09/03/16 explaining that he had passed some dark red blood into the commode that morning. Loose stools, which had resolved with C diff treatment,  recurred 6 days ago.  Hx BPR attributed to hemorrhoids. He's never undergone colonoscopy or endoscopy.  Prior to recent illness, but was not using significant aspirin or nonsteroidal products, just Tylenol when necessary. Patient doesn't drink alcohol. Also does not smoke. No PPI on PTA med list.   Hemoglobin 10.0 >> 9.3. Was 8.4 on 08/03/16.  FOBT positive.  Platelets, coags, BUN normal.      PMH as above Surgical Hx as Above in HPI. S/p shoulder replacement  Prior to Admission medications   Medication Sig Start Date End Date Taking? Authorizing Provider  acetaminophen (TYLENOL) 500 MG tablet Take 500-1,000 mg by mouth every 6 (six) hours as needed (for pain or headaches).     [provider]         Brilinta ECASA 81 mg Hydralazine Vicosin Metoprolol Dakin's solution  Flomax.   Amiodarone Voltaren Gel Lipitor.    Scheduled Meds: . amiodarone  100 mg Oral Daily  . atorvastatin  40 mg Oral QHS  . diclofenac sodium  2 g Topical Q8H  . pantoprazole (PROTONIX) IV  40 mg Intravenous Q12H   Infusions:  PRN Meds:    Allergies as of 09/03/2016  . (No Known Allergies)    History reviewed. No pertinent family history.  No family hx gastro intestinal disease, cancers  Social History   Social History  . Marital status: Single    Spouse name: N/A  . Number of children: N/A  . Years of education: N/A   Occupational History  . Not on file.   Social History Main Topics  . Smoking status: Never Smoker  . Smokeless tobacco: Never Used  . Alcohol  use No  . Drug use: No  . Sexual activity: No   Other Topics Concern  . Not on file   Social History Narrative  . 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      REVIEW OF SYSTEMS: Constitutional:  Still not able to ambulate, can stand with assistance.   ENT:  No nose bleeds Pulm:  No cough or dyspnea CV:  No palpitations, no LE edema. No chest pain GU:  No hematuria, no frequency GI:  Per HPI Heme:  No unusual bleeding or bruising   Transfusions:  none Neuro:  No headaches, no peripheral tingling or numbness Derm:  No itching, no rash or sores.  Endocrine:  No sweats or chills.  No polyuria or dysuria Immunization:  Not queried Travel:  None beyond local counties in last few months.    PHYSICAL EXAM: Vital signs in last 24 hours: Vitals:   09/03/16 2159 09/04/16 0516  BP: (!) 161/77 (!) 166/77  Pulse: 77 71  Resp: 19 18  Temp: 97.9 F (36.6 C) 97.5 F (36.4 C)   Wt Readings from Last 3 Encounters:  09/03/16 (!) 164.2 kg (362 lb)    General: Obese, pleasant, comfortable Head:  No signs of head trauma. No facial asymmetry.  Eyes:  No scleral icterus. No conjunctival pallor. Ears:  Not hard of hearing  Nose:  No congestion, no discharge. Mouth:  Tongue midline. Oral mucosa pink, moist, clear. Dentition in good repair. Neck:  No JVD, no masses, no thyromegaly. Obese. Lungs:  Clear bilaterally. No labored breathing, no cough. Heart: RRR. No MRG. S1, S2 present. Abdomen:  Obese. Not tender. Bowel sounds active. Muffled. Unable to appreciate masses, hernias, organomegaly or bruits.  The wound of the right groin is bandaged..   Rectal: No digital exam performed but liquid stool pooling in bed clothes is liquid, brown and 3 plus FOBT +   Musc/Skeltl: No obvious joint redness, swelling or heat. Gross joint deformities. No muscular atrophy. Extremities:  No CCE.  Neurologic:  Patient is alert. Oriented times 3. Good historian. Moves all 4  limbs, strength not tested. No tremor or gross deficits. Skin:  No rashes, no AVMs, no suspicious lesions, no sores. Tattoos:  None visualized. Nodes:  No cervical adenopathy.   Psych:  Calm, pleasant, cooperative. Not depressed or anxious.  Intake/Output from previous day: 06/27 0701 - 06/28 0700 In: 240 [P.O.:240] Out: 525 [Urine:525] Intake/Output this shift: No intake/output data recorded.  LAB RESULTS:  Recent Labs  09/03/16 1325 09/03/16 2024 09/04/16 0326  WBC 6.7 7.0 6.9  HGB 10.0* 9.6* 9.3*  HCT 32.5* 30.9* 29.9*  PLT 154 190  198   BMET Lab Results  Component Value Date   NA 139 09/03/2016   NA 137 08/10/2016   NA 137 08/07/2016   K 3.5 09/03/2016   K 4.3 08/10/2016   K 4.1 08/07/2016   CL 106 09/03/2016   CL 104 08/10/2016   CL 105 08/07/2016   CO2 22 09/03/2016   CO2 26 08/10/2016   CO2 25 08/07/2016   GLUCOSE 101 (H) 09/03/2016   GLUCOSE 104 (H) 08/10/2016   GLUCOSE 104 (H) 08/07/2016   BUN 8 09/03/2016   BUN 30 (H) 08/10/2016   BUN 35 (H) 08/07/2016   CREATININE 0.98 09/03/2016   CREATININE 1.38 (H) 08/10/2016   CREATININE 1.90 (H) 08/07/2016   CALCIUM 9.0 09/03/2016   CALCIUM 8.4 (L) 08/10/2016   CALCIUM 8.0 (L) 08/07/2016   LFT No results for input(s): PROT, ALBUMIN, AST, ALT, ALKPHOS, BILITOT, BILIDIR, IBILI in the last 72 hours. PT/INR Lab Results  Component Value Date   INR 1.06 09/03/2016   INR 1.20 07/28/2016   INR 1.49 07/10/2016   Hepatitis Panel No results for input(s): HEPBSAG, HCVAB, HEPAIGM, HEPBIGM in the last 72 hours. C-Diff No components found for: CDIFF Lipase  No results found for: LIPASE  Drugs of Abuse  No results found for: LABOPIA, COCAINSCRNUR, LABBENZ, AMPHETMU, THCU, LABBARB   RADIOLOGY STUDIES: No results found.   IMPRESSION:   *  FOBT +, Dark and maroon stool reported.  Now brown liquid and FOBT + Recent treatment for C. difficile resulting in resolution of loose stools. However liquid but not  frequent stools recurred within the last several days. Patient has not been really checked for C. Difficile.  *  Normocytic anemia. Current hemoglobin, though decreased in last 24 hours is actually improved compared with one month ago.  Had significant blood loss anemia in 06/2016 at [place].    *  Recent non STEMI.  Femoral artery injury complicated cath/DES required repair and I and Ds.  Developed Hemorrhagic shock.  Chronic Brilinta and ASA since, both on hold    PLAN:     *  EGD, tomorrow at 12:15.  Will d/w Dr Henrene Pastor.  Oral PPI BID.    Azucena Freed  09/04/2016, 8:22 AM Pager: [phone]  GI ATTENDING  History, laboratories, x-rays reviewed. Patient personally seen and examined. Agree with comprehensive consultation note as outlined above. Pleasant gentleman with multiple significant medical problems recently. Presents here from skilled nursing facility with low-grade GI bleeding which has resolved except for occult blood. Not clear if the bleeding source was upper GI or lower GI. Needs dual antiplatelet therapy which he is on. Plans for upper endoscopy tomorrow. The patient is HIGH RISK. If clear-cut source, that would be satisfactory for now given his multiple other medical issues. If unremarkable, may require in-house colonoscopy.The nature of the procedure, as well as the risks, benefits, and alternatives were carefully and thoroughly reviewed with the patient. Ample time for discussion and questions allowed. The patient understood, was satisfied, and agreed to proceed.  Docia Chuck. Geri Seminole., M.D. [facility]

## 2016-09-03 NOTE — ED Notes (Signed)
Unsuccessful IV start to L inner forearm. IV had good blood return, able to draw labs however it would not advance all the way.

## 2016-09-03 NOTE — ED Provider Notes (Signed)
[facility] DEPT Provider Note   CSN: [id number] Arrival date & time: 09/03/16  1200     History   Chief Complaint Chief Complaint  Patient presents with  . Rectal Bleeding    HPI Wayne Phelps is a 61 y.o. male.  HPI   Pt with hx morbid obesity, recent CABG, femoral artery injury requiring surgical repair, blood transfusion, on brillinta, recent hx c.diff, finishing treatment 2 weeks ago, with initially resolved diarrhea, then loose stools for the past 5-6 days, presents with bright red blood per rectum that occurred today.  Feels he has to strain for bowel movements.  Denies fevers, abdominal pain, N/V.    No past medical history on file.   PMH:   HTN MDD Morbid obesity, BMI 57 NSTEMI AKI  Acute respiratory failure with hypoxia Hypernatremia   There are no active problems to display for this patient.   No past surgical history on file.     Home Medications    Prior to Admission medications   Not on File    Family History No family history on file.  Social History Social History  Substance Use Topics  . Smoking status: Not on file  . Smokeless tobacco: Not on file  . Alcohol use Not on file     Allergies   Patient has no allergy information on record.   Review of Systems Review of Systems  All other systems reviewed and are negative.    Physical Exam Updated Vital Signs BP (!) 150/75   Pulse 64   Temp 97.9 F (36.6 C) (Oral)   Resp 16   SpO2 98%   Physical Exam  Constitutional: He appears well-developed and well-nourished. No distress.  HENT:  Head: Normocephalic and atraumatic.  Neck: Neck supple.  Cardiovascular: Normal rate and regular rhythm.   Pulmonary/Chest: Effort normal and breath sounds normal. No respiratory distress. He has no wheezes. He has no rales.  Abdominal: Soft. He exhibits no distension and no mass. There is no rebound and no guarding.  Morbidly obese   Genitourinary:  Genitourinary Comments: Frank  blood in rectum.  No rectal tenderness, no masses.   Neurological: He is alert. He exhibits normal muscle tone.  Skin: He is not diaphoretic.  Nursing note and vitals reviewed.    ED Treatments / Results  Labs (all labs ordered are listed, but only abnormal results are displayed) Labs Reviewed  BASIC METABOLIC PANEL - Abnormal; Notable for the following:       Result Value   Glucose, Bld 101 (*)    All other components within normal limits  CBC WITH DIFFERENTIAL/PLATELET - Abnormal; Notable for the following:    RBC 3.83 (*)    Hemoglobin 10.0 (*)    HCT 32.5 (*)    RDW 17.5 (*)    All other components within normal limits  POC OCCULT BLOOD, ED - Abnormal; Notable for the following:    Fecal Occult Bld POSITIVE (*)    All other components within normal limits  TYPE AND SCREEN  ABO/RH    EKG  EKG Interpretation None       Radiology No results found.  Procedures Procedures (including critical care time)  Medications Ordered in ED Medications - No data to display   Initial Impression / Assessment and Plan / ED Course  I have reviewed the triage vital signs and the nursing notes.  Pertinent labs & imaging results that were available during my care of the patient were reviewed by  me and considered in my medical decision making (see chart for details).  Clinical Course as of Sep 03 1425  Wed Sep 03, 2016  1417 Admitted to Internal Medicine teaching service.   [EW]    Clinical Course User Index [EW] Wayne Phelps, Wayne Riess, PA-C    Pt on brillinta p/w BRBPR today.  Recent c.diff, now having only 1 loose stool daily, no abdominal pain.  Frank blood on exam.  Hgb is 10.  Admitted to Internal Medicine Teaching Service.  Final Clinical Impressions(s) / ED Diagnoses   Final diagnoses:  Rectal bleeding    New Prescriptions New Prescriptions   No medications on file     Clayton Bibles, Wayne Phelps 09/03/16 Ansley, Julie, MD 09/03/16 [phone]

## 2016-09-03 NOTE — ED Triage Notes (Signed)
Pt presents with dark red blood from rectum that was noted today, pt reports having diarrhea for weeks, is in rehab after stent placement.

## 2016-09-04 ENCOUNTER — Encounter (HOSPITAL_COMMUNITY): Payer: Self-pay | Admitting: *Deleted

## 2016-09-04 DIAGNOSIS — Z6841 Body Mass Index (BMI) 40.0 and over, adult: Secondary | ICD-10-CM | POA: Diagnosis not present

## 2016-09-04 DIAGNOSIS — Z7902 Long term (current) use of antithrombotics/antiplatelets: Secondary | ICD-10-CM

## 2016-09-04 DIAGNOSIS — K649 Unspecified hemorrhoids: Secondary | ICD-10-CM | POA: Diagnosis not present

## 2016-09-04 DIAGNOSIS — K625 Hemorrhage of anus and rectum: Secondary | ICD-10-CM

## 2016-09-04 DIAGNOSIS — Z7982 Long term (current) use of aspirin: Secondary | ICD-10-CM | POA: Diagnosis not present

## 2016-09-04 DIAGNOSIS — K921 Melena: Secondary | ICD-10-CM | POA: Diagnosis present

## 2016-09-04 DIAGNOSIS — R195 Other fecal abnormalities: Secondary | ICD-10-CM | POA: Diagnosis not present

## 2016-09-04 DIAGNOSIS — D649 Anemia, unspecified: Secondary | ICD-10-CM | POA: Diagnosis present

## 2016-09-04 DIAGNOSIS — Z96612 Presence of left artificial shoulder joint: Secondary | ICD-10-CM | POA: Diagnosis present

## 2016-09-04 DIAGNOSIS — Z79899 Other long term (current) drug therapy: Secondary | ICD-10-CM | POA: Diagnosis not present

## 2016-09-04 DIAGNOSIS — Z960 Presence of urogenital implants: Secondary | ICD-10-CM | POA: Diagnosis present

## 2016-09-04 DIAGNOSIS — R339 Retention of urine, unspecified: Secondary | ICD-10-CM | POA: Diagnosis present

## 2016-09-04 DIAGNOSIS — I10 Essential (primary) hypertension: Secondary | ICD-10-CM | POA: Diagnosis present

## 2016-09-04 DIAGNOSIS — D62 Acute posthemorrhagic anemia: Secondary | ICD-10-CM

## 2016-09-04 DIAGNOSIS — Z955 Presence of coronary angioplasty implant and graft: Secondary | ICD-10-CM | POA: Diagnosis not present

## 2016-09-04 DIAGNOSIS — Z951 Presence of aortocoronary bypass graft: Secondary | ICD-10-CM | POA: Diagnosis not present

## 2016-09-04 DIAGNOSIS — I252 Old myocardial infarction: Secondary | ICD-10-CM | POA: Diagnosis not present

## 2016-09-04 DIAGNOSIS — F329 Major depressive disorder, single episode, unspecified: Secondary | ICD-10-CM | POA: Diagnosis present

## 2016-09-04 DIAGNOSIS — D125 Benign neoplasm of sigmoid colon: Secondary | ICD-10-CM | POA: Diagnosis present

## 2016-09-04 DIAGNOSIS — R710 Precipitous drop in hematocrit: Secondary | ICD-10-CM | POA: Diagnosis not present

## 2016-09-04 DIAGNOSIS — E785 Hyperlipidemia, unspecified: Secondary | ICD-10-CM | POA: Diagnosis present

## 2016-09-04 DIAGNOSIS — T45525A Adverse effect of antithrombotic drugs, initial encounter: Secondary | ICD-10-CM | POA: Diagnosis present

## 2016-09-04 DIAGNOSIS — K5730 Diverticulosis of large intestine without perforation or abscess without bleeding: Secondary | ICD-10-CM | POA: Diagnosis present

## 2016-09-04 LAB — CBC
HCT: 29.9 % — ABNORMAL LOW (ref 39.0–52.0)
Hemoglobin: 9.3 g/dL — ABNORMAL LOW (ref 13.0–17.0)
MCH: 26.2 pg (ref 26.0–34.0)
MCHC: 31.1 g/dL (ref 30.0–36.0)
MCV: 84.2 fL (ref 78.0–100.0)
PLATELETS: 198 10*3/uL (ref 150–400)
RBC: 3.55 MIL/uL — AB (ref 4.22–5.81)
RDW: 17.6 % — AB (ref 11.5–15.5)
WBC: 6.9 10*3/uL (ref 4.0–10.5)

## 2016-09-04 LAB — HIV ANTIBODY (ROUTINE TESTING W REFLEX): HIV Screen 4th Generation wRfx: NONREACTIVE

## 2016-09-04 MED ORDER — ASPIRIN 81 MG PO CHEW
81.0000 mg | CHEWABLE_TABLET | Freq: Every day | ORAL | Status: DC
  Administered 2016-09-04 – 2016-09-09 (×6): 81 mg via ORAL
  Filled 2016-09-04 (×6): qty 1

## 2016-09-04 MED ORDER — SODIUM CHLORIDE 0.9 % IV SOLN
INTRAVENOUS | Status: DC
  Administered 2016-09-04: 12:00:00 via INTRAVENOUS

## 2016-09-04 MED ORDER — PANTOPRAZOLE SODIUM 40 MG PO TBEC
40.0000 mg | DELAYED_RELEASE_TABLET | Freq: Two times a day (BID) | ORAL | Status: DC
  Administered 2016-09-04 – 2016-09-07 (×8): 40 mg via ORAL
  Filled 2016-09-04 (×8): qty 1

## 2016-09-04 MED ORDER — ASPIRIN 81 MG PO CHEW: 81.0000 mg | CHEWABLE_TABLET | Freq: Once | ORAL | Status: DC

## 2016-09-04 NOTE — Consult Note (Signed)
[facility] Nurse wound consult note Reason for Consult: Consult requested for right groin wound Wound type: Full thickness post-op wound from previous surgery at another facility. Measurement: 1X4.5X.2cm Wound bed: beefy red  Drainage (amount, consistency, odor) mod amt green drainage, slight odor Periwound: intact skin surrounding Dressing procedure/placement/frequency: Aquacel to absorb drainage and provide antimicrobial benefits, foam dressing to protect from further injury. Discussed plan of care with patient and he verbalized understanding. Please re-consult if further assistance is needed.  Thank-you,  Julien Girt MSN, [place], [place], [place], [place]

## 2016-09-04 NOTE — Progress Notes (Signed)
   Subjective:  Patient was evaluated this morning on rounds. He reports 3 bowel movements in the past 24 hours that were dark colored.  He denies any abdominal pain, nausea or vomiting.  Objective:  Vital signs in last 24 hours: Vitals:   09/03/16 1830 09/03/16 2159 09/04/16 0516 09/04/16 0927  BP:  (!) 161/77 (!) 166/77 (!) 158/80  Pulse: 72 77 71 78  Resp:  19 18 18   Temp:  97.9 F (36.6 C) 97.5 F (36.4 C) 97.8 F (36.6 C)  TempSrc:  Oral Oral Oral  SpO2: 100% 98% 96% 97%  Weight:  (!) 362 lb (164.2 kg)    Height:  5\' 10"  (1.778 m)     Physical Exam  Constitutional: He is well-developed, well-nourished, and in no distress.  Cardiovascular: Normal rate, regular rhythm and normal heart sounds.  Exam reveals no gallop and no friction rub.   No murmur heard. Pulmonary/Chest: Effort normal and breath sounds normal. No respiratory distress. He has no wheezes. He has no rales.  Abdominal: Soft. Bowel sounds are normal. He exhibits no distension and no mass. There is no tenderness. There is no rebound and no guarding.  Skin: Skin is warm and dry.  Right groin wound clean, well healing and no signs of infection      Assessment/Plan:  GI bleed Patient had 3 dark tarry stools this morning.   New baseline hemoglobin is 9-10 due to recent hemorraghic shock in April 2018 due to femoral artery injury during left heart cath procedure.  Patient's hemoglobin on admission was 10 and is currently 9.3. GI evaluated patient and plan is for EGD. - Appreciate GI recommendations - holding brilinta  - CBC in the morning - EGD per GI  History of NSTEMI Patient had DES placement to RCA 06/2016.  He is currently on brilinta and aspirin -holding brilinta -aspirin   History of Clostridium difficile  07/28/2016 patient had positive c.diff antigen.  He reports taking vancomycin for about 2 weeks.  He states that he would have watery stools several times a day when he developed c.diff and the  symptoms resolved after antibiotic course.  No need to retest due to recently being treated.       Right femoral wound During left heart cath on 06/2015 patient's right femoral artery was injured. Patient had vascular surgery and has had ongoing wound care at the site.  - wound care   Urinary retention  Foley catheter in place, no urinary complaints.  He has follow-up on July 9th with urology.  - follow up with urology outpatient   Dispo: Anticipated discharge pending EGD procedure and results.   Kalman Shan [place], DO 09/04/2016, 11:34 AM Pager: [phone]

## 2016-09-05 ENCOUNTER — Encounter (HOSPITAL_COMMUNITY)
Admission: EM | Disposition: A | Discharge status: Home / Self Care | Payer: Self-pay | Source: Home / Self Care | Attending: Internal Medicine

## 2016-09-05 ENCOUNTER — Encounter (HOSPITAL_COMMUNITY): Payer: Self-pay

## 2016-09-05 ENCOUNTER — Inpatient Hospital Stay (HOSPITAL_COMMUNITY): Payer: Managed Care, Other (non HMO) | Admitting: Anesthesiology

## 2016-09-05 DIAGNOSIS — K921 Melena: Secondary | ICD-10-CM | POA: Diagnosis present

## 2016-09-05 HISTORY — PX: ESOPHAGOGASTRODUODENOSCOPY: SHX5428

## 2016-09-05 LAB — CBC
HEMATOCRIT: 29.3 % — AB (ref 39.0–52.0)
Hemoglobin: 8.8 g/dL — ABNORMAL LOW (ref 13.0–17.0)
MCH: 25.4 pg — ABNORMAL LOW (ref 26.0–34.0)
MCHC: 30 g/dL (ref 30.0–36.0)
MCV: 84.7 fL (ref 78.0–100.0)
Platelets: 203 10*3/uL (ref 150–400)
RBC: 3.46 MIL/uL — AB (ref 4.22–5.81)
RDW: 17.3 % — AB (ref 11.5–15.5)
WBC: 7.6 10*3/uL (ref 4.0–10.5)

## 2016-09-05 SURGERY — EGD (ESOPHAGOGASTRODUODENOSCOPY)
Anesthesia: Monitor Anesthesia Care

## 2016-09-05 MED ORDER — ENOXAPARIN SODIUM 80 MG/0.8ML ~~LOC~~ SOLN
80.0000 mg | SUBCUTANEOUS | Status: DC
  Administered 2016-09-05 – 2016-09-09 (×5): 80 mg via SUBCUTANEOUS
  Filled 2016-09-05 (×5): qty 0.8

## 2016-09-05 MED ORDER — METOCLOPRAMIDE HCL 5 MG/ML IJ SOLN: 10.0000 mg | Freq: Once | INTRAMUSCULAR | Status: DC

## 2016-09-05 MED ORDER — LACTATED RINGERS IV SOLN
INTRAVENOUS | Status: DC
  Administered 2016-09-05 (×2): via INTRAVENOUS

## 2016-09-05 MED ORDER — GLYCOPYRROLATE 0.2 MG/ML IJ SOLN
INTRAMUSCULAR | Status: DC | PRN
  Administered 2016-09-05: 0.2 mg via INTRAVENOUS

## 2016-09-05 MED ORDER — PEG-KCL-NACL-NASULF-NA ASC-C 100 G PO SOLR
0.5000 | Freq: Once | ORAL | Status: DC
  Filled 2016-09-05: qty 1

## 2016-09-05 MED ORDER — ENOXAPARIN SODIUM 80 MG/0.8ML ~~LOC~~ SOLN: 80.0000 mg | Freq: Two times a day (BID) | SUBCUTANEOUS | Status: DC

## 2016-09-05 MED ORDER — LIDOCAINE HCL (CARDIAC) 20 MG/ML IV SOLN
INTRAVENOUS | Status: DC | PRN
  Administered 2016-09-05: 100 mg via INTRAVENOUS

## 2016-09-05 MED ORDER — PEG-KCL-NACL-NASULF-NA ASC-C 100 G PO SOLR: 1.0000 | Freq: Once | ORAL | Status: DC

## 2016-09-05 MED ORDER — ACETAMINOPHEN 325 MG PO TABS
650.0000 mg | ORAL_TABLET | Freq: Four times a day (QID) | ORAL | Status: DC | PRN
  Administered 2016-09-05 – 2016-09-08 (×4): 650 mg via ORAL
  Filled 2016-09-05 (×4): qty 2

## 2016-09-05 MED ORDER — PROPOFOL 500 MG/50ML IV EMUL
INTRAVENOUS | Status: DC | PRN
  Administered 2016-09-05: 100 ug/kg/min via INTRAVENOUS

## 2016-09-05 MED ORDER — HYDRALAZINE HCL 20 MG/ML IJ SOLN
5.0000 mg | Freq: Four times a day (QID) | INTRAMUSCULAR | Status: DC | PRN
  Administered 2016-09-05: 5 mg via INTRAVENOUS
  Filled 2016-09-05: qty 1

## 2016-09-05 MED ORDER — PROPOFOL 10 MG/ML IV BOLUS
INTRAVENOUS | Status: DC | PRN
  Administered 2016-09-05: 50 mg via INTRAVENOUS

## 2016-09-05 MED ORDER — BISACODYL 5 MG PO TBEC
10.0000 mg | DELAYED_RELEASE_TABLET | Freq: Every day | ORAL | Status: AC
  Administered 2016-09-06: 10 mg via ORAL
  Filled 2016-09-05: qty 2

## 2016-09-05 MED ORDER — DIPHENHYDRAMINE HCL 25 MG PO CAPS
25.0000 mg | ORAL_CAPSULE | Freq: Four times a day (QID) | ORAL | Status: DC | PRN
  Administered 2016-09-05 – 2016-09-08 (×4): 25 mg via ORAL
  Filled 2016-09-05 (×6): qty 1

## 2016-09-05 NOTE — Anesthesia Preprocedure Evaluation (Addendum)
Anesthesia Evaluation  Patient identified by MRN, date of birth, ID band Patient awake    Reviewed: Allergy & Precautions, NPO status , Patient's Chart, lab work & pertinent test results  Airway Mallampati: II  TM Distance: >3 FB Neck ROM: Full    Dental no notable dental hx.    Pulmonary neg pulmonary ROS,    Pulmonary exam normal breath sounds clear to auscultation       Cardiovascular hypertension, Pt. on medications and Pt. on home beta blockers + CAD, + Past MI and + Cardiac Stents  Normal cardiovascular exam(-) Valvular Problems/Murmurs Rhythm:Regular Rate:Normal  ECG: SR, rate 64     Neuro/Psych negative neurological ROS  negative psych ROS   GI/Hepatic Neg liver ROS,   Endo/Other  Morbid obesity  Renal/GU negative Renal ROS  negative genitourinary   Musculoskeletal negative musculoskeletal ROS (+)   Abdominal   Peds negative pediatric ROS (+)  Hematology  (+) anemia ,   Anesthesia Other Findings Super obese  Hyperlipidemia   Reproductive/Obstetrics negative OB ROS                            Anesthesia Physical Anesthesia Plan  ASA: IV  Anesthesia Plan: MAC   Post-op Pain Management:    Induction: Intravenous  PONV Risk Score and Plan: 1 and Propofol and Treatment may vary due to age or medical condition  Airway Management Planned:   Additional Equipment:   Intra-op Plan:   Post-operative Plan:   Informed Consent: I have reviewed the patients History and Physical, chart, labs and discussed the procedure including the risks, benefits and alternatives for the proposed anesthesia with the patient or authorized representative who has indicated his/her understanding and acceptance.   Dental advisory given  Plan Discussed with: CRNA  Anesthesia Plan Comments:         Anesthesia Quick Evaluation

## 2016-09-05 NOTE — Interval H&P Note (Signed)
History and Physical Interval Note:  09/05/2016 11:07 AM  Wayne Phelps  has presented today for surgery, with the diagnosis of gi bleed, melena, anemia  The various methods of treatment have been discussed with the patient and family. After consideration of risks, benefits and other options for treatment, the patient has consented to  Procedure(s): ESOPHAGOGASTRODUODENOSCOPY (EGD) (N/A) as a surgical intervention .  The patient's history has been reviewed, patient examined, no change in status, stable for surgery.  I have reviewed the patient's chart and labs.  Questions were answered to the patient's satisfaction.     Scarlette Shorts

## 2016-09-05 NOTE — H&P (View-Only) (Signed)
[facility] Consult: 8:22 AM 09/04/2016  LOS: 0 days    Referring Provider: Kalman Shan M.D., resident.  Primary Care Physician:  [facility] [place], [place] Rebollar PA-C Primary Gastroenterologist:  Althia Forts  Pt's personal Cell # is [phone].     Reason for Consultation: maroon stool   HPI: Wayne Phelps is a 61 y.o. male.  PMH HTN.  Major depression.  Morbid obesity, 400 #, BMI 57.  NSTEMI with admission 06/13/2016 to [facility] in [place].  Underwent drug-eluting stent to RCA 3/72, complicated by femoral artery injury.  Required right common femoral artery repair on 4 6 with subsequent wound to provide mint, wound VAC placement on at least 6 separate occasions, last occurred on 07/08/16. He is on dual antiplatelet therapy.  With these complications he developed hemorrhagic shock and required repeated intubations and ventilator support.   Treated for transfusion requiring anemia. Amiodarone for nonsustained V. tach.  Foley (remains in place) for inability to urinate.  He was discharged 07/09/16 to [facility] and 3 weeks ago discharged to [facility].  Treated with 10 to 14 days oral Vanc for C diff at [facility].  404# at discharge from [place]. .    For nearly 1 week, stools looser but brown, no recheck for C diff as far as he is aware.  Yesterday AM had single maroon stool.  1 last night, not described.  1 this AM brown, liquid.   No abd pain.  No N/V.  No anorexia.  No dysphagia, passed FEES swallow eval in late 06/2016.  Infrequent pyrosis.  For 5 years ago he had limited blood per rectum which was attributed to hemorrhoids. No previous EGD or colonoscopy.  Unaware of any liver issues but never went to MD much.   CT abdomen, stone protocol, 07/03/16: Possible  small subcentimeter hypodense lesion in the inferior right hepatic lobe, not well characterized without contrast. The gallbladder, pancreas, spleen, and adrenal glands have an unremarkable noncontrast appearance.  Arrived in the emergency room after transport from rehab facilityon 09/03/16 explaining that he had passed some dark red blood into the commode that morning. Loose stools, which had resolved with C diff treatment,  recurred 6 days ago.  Hx BPR attributed to hemorrhoids. He's never undergone colonoscopy or endoscopy.  Prior to recent illness, but was not using significant aspirin or nonsteroidal products, just Tylenol when necessary. Patient doesn't drink alcohol. Also does not smoke. No PPI on PTA med list.   Hemoglobin 10.0 >> 9.3. Was 8.4 on 08/03/16.  FOBT positive.  Platelets, coags, BUN normal.      PMH as above Surgical Hx as Above in HPI. S/p shoulder replacement  Prior to Admission medications   Medication Sig Start Date End Date Taking? Authorizing Provider  acetaminophen (TYLENOL) 500 MG tablet Take 500-1,000 mg by mouth every 6 (six) hours as needed (for pain or headaches).     [provider]         Brilinta ECASA 81 mg Hydralazine Vicosin Metoprolol Dakin's solution  Flomax.   Amiodarone Voltaren Gel Lipitor.    Scheduled Meds: . amiodarone  100 mg Oral Daily  . atorvastatin  40 mg Oral QHS  . diclofenac sodium  2 g Topical Q8H  . pantoprazole (PROTONIX) IV  40 mg Intravenous Q12H   Infusions:  PRN Meds:    Allergies as of 09/03/2016  . (No Known Allergies)    History reviewed. No pertinent family history.  No family hx gastro intestinal disease, cancers  Social History   Social History  . Marital status: Single    Spouse name: N/A  . Number of children: N/A  . Years of education: N/A   Occupational History  . Not on file.   Social History Main Topics  . Smoking status: Never Smoker  . Smokeless tobacco: Never Used  . Alcohol  use No  . Drug use: No  . Sexual activity: No   Other Topics Concern  . Not on file   Social History Narrative  . 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      REVIEW OF SYSTEMS: Constitutional:  Still not able to ambulate, can stand with assistance.   ENT:  No nose bleeds Pulm:  No cough or dyspnea CV:  No palpitations, no LE edema. No chest pain GU:  No hematuria, no frequency GI:  Per HPI Heme:  No unusual bleeding or bruising   Transfusions:  none Neuro:  No headaches, no peripheral tingling or numbness Derm:  No itching, no rash or sores.  Endocrine:  No sweats or chills.  No polyuria or dysuria Immunization:  Not queried Travel:  None beyond local counties in last few months.    PHYSICAL EXAM: Vital signs in last 24 hours: Vitals:   09/03/16 2159 09/04/16 0516  BP: (!) 161/77 (!) 166/77  Pulse: 77 71  Resp: 19 18  Temp: 97.9 F (36.6 C) 97.5 F (36.4 C)   Wt Readings from Last 3 Encounters:  09/03/16 (!) 164.2 kg (362 lb)    General: Obese, pleasant, comfortable Head:  No signs of head trauma. No facial asymmetry.  Eyes:  No scleral icterus. No conjunctival pallor. Ears:  Not hard of hearing  Nose:  No congestion, no discharge. Mouth:  Tongue midline. Oral mucosa pink, moist, clear. Dentition in good repair. Neck:  No JVD, no masses, no thyromegaly. Obese. Lungs:  Clear bilaterally. No labored breathing, no cough. Heart: RRR. No MRG. S1, S2 present. Abdomen:  Obese. Not tender. Bowel sounds active. Muffled. Unable to appreciate masses, hernias, organomegaly or bruits.  The wound of the right groin is bandaged..   Rectal: No digital exam performed but liquid stool pooling in bed clothes is liquid, brown and 3 plus FOBT +   Musc/Skeltl: No obvious joint redness, swelling or heat. Gross joint deformities. No muscular atrophy. Extremities:  No CCE.  Neurologic:  Patient is alert. Oriented times 3. Good historian. Moves all 4  limbs, strength not tested. No tremor or gross deficits. Skin:  No rashes, no AVMs, no suspicious lesions, no sores. Tattoos:  None visualized. Nodes:  No cervical adenopathy.   Psych:  Calm, pleasant, cooperative. Not depressed or anxious.  Intake/Output from previous day: 06/27 0701 - 06/28 0700 In: 240 [P.O.:240] Out: 525 [Urine:525] Intake/Output this shift: No intake/output data recorded.  LAB RESULTS:  Recent Labs  09/03/16 1325 09/03/16 2024 09/04/16 0326  WBC 6.7 7.0 6.9  HGB 10.0* 9.6* 9.3*  HCT 32.5* 30.9* 29.9*  PLT 154 190  198   BMET Lab Results  Component Value Date   NA 139 09/03/2016   NA 137 08/10/2016   NA 137 08/07/2016   K 3.5 09/03/2016   K 4.3 08/10/2016   K 4.1 08/07/2016   CL 106 09/03/2016   CL 104 08/10/2016   CL 105 08/07/2016   CO2 22 09/03/2016   CO2 26 08/10/2016   CO2 25 08/07/2016   GLUCOSE 101 (H) 09/03/2016   GLUCOSE 104 (H) 08/10/2016   GLUCOSE 104 (H) 08/07/2016   BUN 8 09/03/2016   BUN 30 (H) 08/10/2016   BUN 35 (H) 08/07/2016   CREATININE 0.98 09/03/2016   CREATININE 1.38 (H) 08/10/2016   CREATININE 1.90 (H) 08/07/2016   CALCIUM 9.0 09/03/2016   CALCIUM 8.4 (L) 08/10/2016   CALCIUM 8.0 (L) 08/07/2016   LFT No results for input(s): PROT, ALBUMIN, AST, ALT, ALKPHOS, BILITOT, BILIDIR, IBILI in the last 72 hours. PT/INR Lab Results  Component Value Date   INR 1.06 09/03/2016   INR 1.20 07/28/2016   INR 1.49 07/10/2016   Hepatitis Panel No results for input(s): HEPBSAG, HCVAB, HEPAIGM, HEPBIGM in the last 72 hours. C-Diff No components found for: CDIFF Lipase  No results found for: LIPASE  Drugs of Abuse  No results found for: LABOPIA, COCAINSCRNUR, LABBENZ, AMPHETMU, THCU, LABBARB   RADIOLOGY STUDIES: No results found.   IMPRESSION:   *  FOBT +, Dark and maroon stool reported.  Now brown liquid and FOBT + Recent treatment for C. difficile resulting in resolution of loose stools. However liquid but not  frequent stools recurred within the last several days. Patient has not been really checked for C. Difficile.  *  Normocytic anemia. Current hemoglobin, though decreased in last 24 hours is actually improved compared with one month ago.  Had significant blood loss anemia in 06/2016 at [place].    *  Recent non STEMI.  Femoral artery injury complicated cath/DES required repair and I and Ds.  Developed Hemorrhagic shock.  Chronic Brilinta and ASA since, both on hold    PLAN:     *  EGD, tomorrow at 12:15.  Will d/w Dr Henrene Pastor.  Oral PPI BID.    Azucena Freed  09/04/2016, 8:22 AM Pager: [phone]  GI ATTENDING  History, laboratories, x-rays reviewed. Patient personally seen and examined. Agree with comprehensive consultation note as outlined above. Pleasant gentleman with multiple significant medical problems recently. Presents here from skilled nursing facility with low-grade GI bleeding which has resolved except for occult blood. Not clear if the bleeding source was upper GI or lower GI. Needs dual antiplatelet therapy which he is on. Plans for upper endoscopy tomorrow. The patient is HIGH RISK. If clear-cut source, that would be satisfactory for now given his multiple other medical issues. If unremarkable, may require in-house colonoscopy.The nature of the procedure, as well as the risks, benefits, and alternatives were carefully and thoroughly reviewed with the patient. Ample time for discussion and questions allowed. The patient understood, was satisfied, and agreed to proceed.  Docia Chuck. Geri Semin[place]., M.D. [facility] Division of Gastroenterology

## 2016-09-05 NOTE — Progress Notes (Signed)
   Subjective:  Patient was evaluated this morning on rounds. He reports 1 bowel movements in the past 24 hours that was light brown.  He denies any abdominal pain, nausea or vomiting.  Objective:  Vital signs in last 24 hours: Vitals:   09/04/16 0927 09/04/16 1754 09/04/16 1900 09/05/16 0528  BP: (!) 158/80 (!) 164/79 (!) 187/88 (!) 168/81  Pulse: 78 74 75 72  Resp: 18 16 17 16   Temp: 97.8 F (36.6 C) 98.6 F (37 C) 97.9 F (36.6 C) 97.8 F (36.6 C)  TempSrc: Oral Oral Oral Oral  SpO2: 97% 99% 95% 97%  Weight:   (!) 353 lb (160.1 kg)   Height:       Physical Exam  Constitutional: He is well-developed, well-nourished, and in no distress.  Cardiovascular: Normal rate, regular rhythm and normal heart sounds.  Exam reveals no gallop and no friction rub.   No murmur heard. Pulmonary/Chest: Effort normal and breath sounds normal. No respiratory distress. He has no wheezes. He has no rales.  Abdominal: Soft. Bowel sounds are normal. He exhibits no distension and no mass. There is no tenderness. There is no rebound and no guarding.  Skin: Skin is warm and dry.      Assessment/Plan:  GI bleed Patient had one light brown stool this morning.   New baseline hemoglobin is 9-10 due to recent hemorraghic shock in April 2018 due to femoral artery injury during left heart cath procedure.  Patient's hemoglobin on admission was 10 and is currently 8.8. GI evaluated patient and plan is for EGD. - Appreciate GI recommendations - holding brilinta  - CBC in the morning - EGD per GI  History of NSTEMI Patient had DES placement to RCA 06/2016.  He is currently on brilinta and aspirin -holding brilinta -aspirin   History of Clostridium difficile  07/28/2016 patient had positive c.diff antigen.  He reports taking vancomycin for about 2 weeks.  He states that he would have watery stools several times a day when he developed c.diff and the symptoms resolved after antibiotic course.  No need to  retest due to recently being treated.  He denies multiple watery stools during this admission.       Right femoral wound During left heart cath on 06/2015 patient's right femoral artery was injured. Patient had vascular surgery and has had ongoing wound care at the site.  - wound care   Urinary retention  Foley catheter in place, no urinary complaints.  He has follow-up on July 9th with urology.  - follow up with urology outpatient   Dispo: Anticipated discharge pending EGD procedure and results.   Kalman Shan [place], DO 09/05/2016, 7:51 AM Pager: [phone]

## 2016-09-05 NOTE — Anesthesia Postprocedure Evaluation (Signed)
Anesthesia Post Note  Patient: ZEESHAN KORTE  Procedure(s) Performed: Procedure(s) (LRB): ESOPHAGOGASTRODUODENOSCOPY (EGD) (N/A)     Patient location during evaluation: PACU Anesthesia Type: MAC Level of consciousness: awake and alert Pain management: pain level controlled Vital Signs Assessment: post-procedure vital signs reviewed and stable Respiratory status: spontaneous breathing, nonlabored ventilation, respiratory function stable and patient connected to nasal cannula oxygen Cardiovascular status: stable and blood pressure returned to baseline Anesthetic complications: no    Last Vitals:  Vitals:   09/05/16 1513 09/05/16 1650  BP: (!) 180/100   Pulse: 79 81  Resp: 18 19  Temp:  36.3 C    Last Pain:  Vitals:   09/05/16 1650  TempSrc: Oral  PainSc:                  Ryan P Ellender

## 2016-09-05 NOTE — Op Note (Signed)
[facility] Patient Name: Wayne Phelps Procedure Date : 09/05/2016 MRN: [id number] Attending MD: Docia Chuck. Henrene Pastor , MD Date of Birth: Dec 12, 1955 CSN: [id number] Age: 61 Admit Type: Outpatient Procedure:                Upper GI endoscopy Indications:              Heme positive stool, Melena, maroon stools Providers:                Docia Chuck. Henrene Pastor, MD, Cleda Daub, RN, Cherylynn Ridges, Technician, Raphael Gibney, CRNA Referring MD:             [facility] Medicines:                Monitored Anesthesia Care Complications:            No immediate complications. Estimated Blood Loss:     Estimated blood loss: none. Procedure:                Pre-Anesthesia Assessment:                           - Prior to the procedure, a History and Physical                            was performed, and patient medications and                            allergies were reviewed. The patient's tolerance of                            previous anesthesia was also reviewed. The risks                            and benefits of the procedure and the sedation                            options and risks were discussed with the patient.                            All questions were answered, and informed consent                            was obtained. Prior Anticoagulants: The patient has                            taken Plavix (clopidogrel), last dose was day of                            procedure. ASA Grade Assessment: III - A patient                            with severe systemic disease. After reviewing the  risks and benefits, the patient was deemed in                            satisfactory condition to undergo the procedure.                           After obtaining informed consent, the endoscope was                            passed under direct vision. Throughout the                            procedure, the patient's blood  pressure, pulse, and                            oxygen saturations were monitored continuously. The                            EG-[id number] ([id number]) scope was introduced through the                            mouth, and advanced to the second part of duodenum.                            The upper GI endoscopy was accomplished without                            difficulty. The patient tolerated the procedure                            well. Scope In: Scope Out: Findings:      The esophagus was normal.      The stomach was normal.      The examined duodenum was normal.      The cardia and gastric fundus were normal on retroflexion. Impression:               - Normal EGD                           - No cause for bleeding found. Rule out lower GI                            source. Moderate Sedation:      none Recommendation:           1. The patient will need COLONOSCOPY. Given his                            size, comorbidities, and the need for monitored                            anesthesia care this will be planned for [facility].                            The GI team will see him on Sunday  and provide                            appropriate orders for colonoscopy preparation. Procedure Code(s):        --- Professional ---                           [phone], Esophagogastroduodenoscopy, flexible,                            transoral; diagnostic, including collection of                            specimen(s) by brushing or washing, when performed                            (separate procedure) Diagnosis Code(s):        --- Professional ---                           R19.5, Other fecal abnormalities                           K92.1, Melena (includes Hematochezia) CPT copyright 2016 American Medical Association. All rights reserved. The codes documented in this report are preliminary and upon coder review may  be revised to meet current compliance requirements. Docia Chuck. Henrene Pastor, MD 09/05/2016 11:28:47  AM This report has been signed electronically. Number of Addenda: 0

## 2016-09-05 NOTE — Transfer of Care (Signed)
Immediate Anesthesia Transfer of Care Note  Patient: Wayne Phelps  Procedure(s) Performed: Procedure(s): ESOPHAGOGASTRODUODENOSCOPY (EGD) (N/A)  Patient Location: Endoscopy Unit  Anesthesia Type:MAC  Level of Consciousness: awake, alert  and oriented  Airway & Oxygen Therapy: Patient Spontanous Breathing and Patient connected to nasal cannula oxygen  Post-op Assessment: Report given to RN and Post -op Vital signs reviewed and stable  Post vital signs: Reviewed and stable  Last Vitals:  Vitals:   09/05/16 1006 09/05/16 1137  BP: (!) 193/96 (!) 185/103  Pulse: 76 99  Resp: 16 17  Temp: 36.7 C 36.6 C    Last Pain:  Vitals:   09/05/16 1137  TempSrc: Oral  PainSc:          Complications: No apparent anesthesia complications

## 2016-09-06 ENCOUNTER — Encounter (HOSPITAL_COMMUNITY): Payer: Self-pay | Admitting: Internal Medicine

## 2016-09-06 LAB — CBC
HCT: 30.3 % — ABNORMAL LOW (ref 39.0–52.0)
HEMOGLOBIN: 9.2 g/dL — AB (ref 13.0–17.0)
MCH: 26.1 pg (ref 26.0–34.0)
MCHC: 30.4 g/dL (ref 30.0–36.0)
MCV: 85.8 fL (ref 78.0–100.0)
PLATELETS: 193 10*3/uL (ref 150–400)
RBC: 3.53 MIL/uL — AB (ref 4.22–5.81)
RDW: 17.5 % — ABNORMAL HIGH (ref 11.5–15.5)
WBC: 6.9 10*3/uL (ref 4.0–10.5)

## 2016-09-06 MED ORDER — HYDRALAZINE HCL 50 MG PO TABS
50.0000 mg | ORAL_TABLET | Freq: Three times a day (TID) | ORAL | Status: DC
  Administered 2016-09-06 – 2016-09-09 (×9): 50 mg via ORAL
  Filled 2016-09-06 (×9): qty 1

## 2016-09-06 NOTE — Progress Notes (Signed)
   Subjective:  He is having formed bowel movements without any visible blood. He denies abdominal pain. HE underwent EGD yesterday that did not identify any source of GI blood loss.  Objective:  Vital signs in last 24 hours: Vitals:   09/05/16 1650 09/05/16 2228 09/06/16 0507 09/06/16 1002  BP:  (!) 181/87 (!) 160/90 (!) 175/77  Pulse: 81 74 70 70  Resp: 19 18 18 17   Temp: 97.4 F (36.3 C) 97.5 F (36.4 C) 97.8 F (36.6 C) 97.7 F (36.5 C)  TempSrc: Oral Oral Oral Oral  SpO2: 96% 97% 99% 95%  Weight:  (!) 358 lb (162.4 kg)    Height:       Physical Exam  Constitutional: He is well-developed, well-nourished, and in no distress.  Cardiovascular: Normal rate, regular rhythm and normal heart sounds.  Exam reveals no gallop and no friction rub.   No murmur heard. Pulmonary/Chest: Effort normal and breath sounds normal.  Abdominal: Soft. Bowel sounds are normal. There is no tenderness. There is no rebound and no guarding.  Musculoskeletal:  Stasis skin changes over right medial leg  Skin: Skin is warm and dry.      Assessment/Plan: 61 y/o man with a history of recent NSTEMI complicated by femoral artery laceration during LHC, HTN, urinary retention, morbid obesity, and recently treated C. Diff who came from SNF due to new onset of both bright red and dark rectal bleeding.  GI bleed His hemoglobin remains stable today at 9.2 and he has not seen any more bright red or maroon colored stools. EGD yesterday was unremarkable with next planned step for colonoscopy. We stopped brilinta but are continuing ASA as well as prophylactic dose anticoagulation while here and bedbound. Differential remains open including diverticulosis, although AVM or other lesion seems very possible considering an acute onset. He will need to go onto a liquid diet for bowel preparation tomorrow but we will observe alone today. He will need a rectal tube for stool diversion during prep as he cannot transfer or  travel readily for frequent bowel movements.   History of NSTEMI Right femoral wound Severe deconditioning Patient had DES placement to RCA 06/2016. During left heart cath on 06/2015 patient's right femoral artery was lacerated. It has been slow to heal with complications but currently feels okay with wound care managing dressings. He was on DAPT for this but currently held to ASA alone as above. He was undergoing rehabilitation prior to this admission. We will try to arrange for exercise bands or other PT equipment to be available for the next few days while we work up his bleeding.  History of Clostridium difficile  07/28/2016 patient had positive c.diff antigen and watery stools that resolved at treatment with PO vancomycin. Currently asymptomatic and having formed stools.  Urinary retention  His foley catheter is planned to remain until follow-up on July 9th with urology. He has no associated complaints at this time.  Dispo: Anticipated discharge pending colonoscopy and results  Collier [place], MD PGY-II Internal Medicine Resident Pager# [phone] 09/06/2016, 12:38 PM

## 2016-09-06 NOTE — Evaluation (Signed)
Physical Therapy Evaluation Patient Details Name: Wayne Phelps MRN: [id number] DOB: 03-15-1955 Today's Date: 09/06/2016   History of Present Illness  Pt is a 61 yo male admitted through ED on 09/03/16 with possible GI bleed. Pt underwent an endoscopy on 6/29 with no significant findings and now awaits a colonoscopy. PMH significant for NSTEMI 06/13/16 with a common femoral artery injury requiring surgical repair and pt went into hemorrhagic shock requiring intubation and vent placement. Pt discharged to [facility] 07/09/16 on vent and recently discharged to [facility] s/p 3 weeks for rehab. No other significant PMH on file except left reverse shoulder replacement.    Clinical Impression  Pt presents with the above diagnosis and below deficits for therapy evaluation. Prior to admission, pt was at a SNF for rehab after the above incident resulting in almost complete loss of mobility. Pt is making progress with mobility at SNF. Pt requires Max A for bed mobility this session for safety with +2 assist. Pt will require +3 for any attempts at standing. Pt will benefit from continued acute follow-up in order to progress LE strengthening and transfers prior to discharge back to SNF.     Follow Up Recommendations SNF;Supervision/Assistance - 24 hour    Equipment Recommendations  None recommended by PT    Recommendations for Other Services       Precautions / Restrictions Precautions Precautions: Fall Restrictions Weight Bearing Restrictions: No      Mobility  Bed Mobility Overal bed mobility: Needs Assistance Bed Mobility: Supine to Sit;Sit to Supine     Supine to sit: Max assist;+2 for physical assistance;HOB elevated Sit to supine: Max assist;+2 for physical assistance   General bed mobility comments: Max A +2 for bed mobility due to air mattress. Pt reports being able to complete this task without assistance previously.   Transfers                 General transfer  comment: Did not attempt this sesison  Ambulation/Gait             General Gait Details: Unable  Stairs            Wheelchair Mobility    Modified Rankin (Stroke Patients Only)       Balance Overall balance assessment: Needs assistance Sitting-balance support: No upper extremity supported;Feet supported Sitting balance-Leahy Scale: Fair                                       Pertinent Vitals/Pain Pain Assessment: No/denies pain    Home Living Family/patient expects to be discharged to:: Skilled nursing facility                      Prior Function Level of Independence: Needs assistance   Gait / Transfers Assistance Needed: unable to ambulate and unable to stand without equipment  ADL's / Homemaking Assistance Needed: requries assistance for all ADLs and IADLs        Hand Dominance   Dominant Hand: Right    Extremity/Trunk Assessment   Upper Extremity Assessment Upper Extremity Assessment: Generalized weakness    Lower Extremity Assessment Lower Extremity Assessment: Generalized weakness    Cervical / Trunk Assessment Cervical / Trunk Assessment: Normal  Communication   Communication: No difficulties  Cognition Arousal/Alertness: Awake/alert Behavior During Therapy: WFL for tasks assessed/performed Overall Cognitive Status: Within Functional Limits for tasks assessed  General Comments      Exercises General Exercises - Lower Extremity Long Arc Quad: AROM;Both;10 reps;Seated Hip Flexion/Marching: AROM;Both;10 reps;Seated   Assessment/Plan    PT Assessment Patient needs continued PT services  PT Problem List Decreased strength;Decreased activity tolerance;Decreased balance;Decreased mobility;Decreased knowledge of use of DME       PT Treatment Interventions DME instruction;Functional mobility training;Therapeutic activities;Therapeutic exercise;Balance  training    PT Goals (Current goals can be found in the Care Plan section)  Acute Rehab PT Goals Patient Stated Goal: to get back to rehab for recovery PT Goal Formulation: With patient Time For Goal Achievement: 09/20/16 Potential to Achieve Goals: Fair    Frequency Min 2X/week   Barriers to discharge        Co-evaluation               AM-PAC PT "6 Clicks" Daily Activity  Outcome Measure Difficulty turning over in bed (including adjusting bedclothes, sheets and blankets)?: Total Difficulty moving from lying on back to sitting on the side of the bed? : Total Difficulty sitting down on and standing up from a chair with arms (e.g., wheelchair, bedside commode, etc,.)?: Total Help needed moving to and from a bed to chair (including a wheelchair)?: Total Help needed walking in hospital room?: Total Help needed climbing 3-5 steps with a railing? : Total 6 Click Score: 6    End of Session   Activity Tolerance: Patient limited by fatigue Patient left: in bed;with call bell/phone within reach Nurse Communication: Mobility status PT Visit Diagnosis: Unsteadiness on feet (R26.81);Muscle weakness (generalized) (M62.81)    Time: 1235-1300 PT Time Calculation (min) (ACUTE ONLY): 25 min   Charges:   PT Evaluation $PT Eval Moderate Complexity: 1 Procedure PT Treatments $Therapeutic Activity: 8-22 mins   PT G Codes:        Scheryl Marten PT, DPT  [phone]   Jacqulyn Liner Sloan Leiter 09/06/2016, 2:12 PM

## 2016-09-07 ENCOUNTER — Encounter (HOSPITAL_COMMUNITY): Payer: Self-pay | Admitting: Internal Medicine

## 2016-09-07 DIAGNOSIS — R195 Other fecal abnormalities: Secondary | ICD-10-CM

## 2016-09-07 DIAGNOSIS — R710 Precipitous drop in hematocrit: Secondary | ICD-10-CM

## 2016-09-07 MED ORDER — PEG-KCL-NACL-NASULF-NA ASC-C 100 G PO SOLR: 0.5000 | Freq: Once | ORAL | Status: DC

## 2016-09-07 MED ORDER — METOCLOPRAMIDE HCL 5 MG/ML IJ SOLN
10.0000 mg | Freq: Once | INTRAMUSCULAR | Status: AC
  Administered 2016-09-07: 10 mg via INTRAVENOUS
  Filled 2016-09-07: qty 2

## 2016-09-07 MED ORDER — BISACODYL 5 MG PO TBEC
20.0000 mg | DELAYED_RELEASE_TABLET | Freq: Once | ORAL | Status: AC
  Administered 2016-09-07: 20 mg via ORAL
  Filled 2016-09-07 (×2): qty 4

## 2016-09-07 MED ORDER — PEG 3350-KCL-NA BICARB-NACL 420 G PO SOLR
4000.0000 mL | Freq: Once | ORAL | Status: AC
  Administered 2016-09-07: 4000 mL via ORAL
  Filled 2016-09-07: qty 4000

## 2016-09-07 MED ORDER — PEG 3350-KCL-NABCB-NACL-NASULF 236 G PO SOLR
4000.0000 mL | Freq: Once | ORAL | Status: AC
  Administered 2016-09-08: 4000 mL via ORAL

## 2016-09-07 NOTE — Progress Notes (Signed)
Consent signed for colonoscopy and placed in chart.

## 2016-09-07 NOTE — Progress Notes (Signed)
   Patient Name: Wayne Phelps Date of Encounter: 09/07/2016, 8:45 AM    Subjective  For colonoscopy tomorrow No new c/o  Objective  BP (!) 184/100 (BP Location: Left Arm)   Pulse 77   Temp 98.4 F (36.9 C) (Oral)   Resp 16   Ht 5\' 10"  (1.778 m)   Wt (!) 358 lb (162.4 kg)   SpO2 95%   BMI 51.37 kg/m  NAD  CBC Latest Ref Rng & Units 09/06/2016 09/05/2016 09/04/2016  WBC 4.0 - 10.5 K/uL 6.9 7.6 6.9  Hemoglobin 13.0 - 17.0 g/dL 9.2(L) 8.8(L) 9.3(L)  Hematocrit 39.0 - 52.0 % 30.3(L) 29.3(L) 29.9(L)  Platelets 150 - 400 K/uL 193 203 198       Assessment and Plan   Heme+ anemia , hx reportedmelna/marroon stools - GI bleeding recent DES for non-STEMI last Brillinta and ASA about 6/27  Colonoscopy tomorrow - Dr. Janeece Fitting, MD, [facility] [phone] (pager) [phone] after 5 PM, weekends and holidays  09/07/2016 8:45 AM

## 2016-09-07 NOTE — Progress Notes (Signed)
Rectal tube placed per MD order.  Patient tolerated well.

## 2016-09-07 NOTE — Progress Notes (Signed)
° °  Subjective:  He is having formed bowel movements without any visible blood. He denies abdominal pain. HE underwent EGD on 6/29 that did not identify any source of GI blood loss.  Objective:  Vital signs in last 24 hours: Vitals:   09/06/16 1002 09/06/16 2100 09/07/16 0547 09/07/16 1046  BP: (!) 175/77 (!) 162/82 (!) 184/100 (!) 171/92  Pulse: 70 72 77 74  Resp: 17 16 16 18   Temp: 97.7 F (36.5 C) 97.8 F (36.6 C) 98.4 F (36.9 C) 98.1 F (36.7 C)  TempSrc: Oral Oral Oral Oral  SpO2: 95% 95% 95% 96%  Weight:      Height:       Physical Exam  Constitutional: He is well-developed, well-nourished, and in no distress.  Cardiovascular: Normal rate, regular rhythm and normal heart sounds.  Exam reveals no gallop and no friction rub.   No murmur heard. Pulmonary/Chest: Effort normal and breath sounds normal.  Abdominal: Soft. Bowel sounds are normal. There is no tenderness.  Musculoskeletal:  Stasis skin changes over right medial leg  Skin: Skin is warm and dry.      Assessment/Plan: 61 y/o man with a history of recent NSTEMI complicated by femoral artery laceration during LHC, HTN, urinary retention, morbid obesity, and recently treated C. Diff who came from SNF due to new onset of both bright red and dark rectal bleeding.  GI bleed His hemoglobin stable on 6/30 at 9.2 and he has not seen any more bright red or maroon colored stools. EGD on 6/29 was unremarkable, colonoscopy planned for 7/2.  - We stopped brilinta but are continuing ASA as well as prophylactic dose anticoagulation while here and bedbound.  - Differential remains open including diverticulosis, although AVM or other lesion seems very possible considering an acute onset.  - On a liquid diet for bowel preparation today, will be NPO at midnight  - Rectal tube for stool diversion during prep as he cannot transfer or travel readily for frequent bowel movements.   History of NSTEMI Right femoral wound Severe  deconditioning Patient had DES placement to RCA 06/2016. During left heart cath on 06/2015 patient's right femoral artery was lacerated. It has been slow to heal with complications but currently feels okay with wound care managing dressings. He was on DAPT for this but currently held to ASA alone as above. He was undergoing rehabilitation prior to this admission. We will try to arrange for exercise bands or other PT equipment to be available for the next few days while we work up his bleeding.  Hypertension BP range 672-094 Systolic.  - On 5mg  Hydralazine IV PRN if SBP >180  - Scheduled 50mg  Hydralazine q8Hours added on 6/30.  History of Clostridium difficile  07/28/2016 patient had positive c.diff antigen and watery stools that resolved at treatment with PO vancomycin. Currently asymptomatic and having formed stools.  Urinary retention  His foley catheter is planned to remain until follow-up on July 9th with urology. He has no associated complaints at this time.  Dispo: Anticipated discharge pending colonoscopy and results  Pearson Grippe, DO IM PGY-1 Pager: [phone] 09/07/2016, 11:23 AM

## 2016-09-08 ENCOUNTER — Inpatient Hospital Stay (HOSPITAL_COMMUNITY): Payer: Managed Care, Other (non HMO) | Admitting: Certified Registered"

## 2016-09-08 ENCOUNTER — Encounter (HOSPITAL_COMMUNITY): Payer: Self-pay

## 2016-09-08 ENCOUNTER — Encounter (HOSPITAL_COMMUNITY)
Admission: EM | Disposition: A | Discharge status: Home / Self Care | Payer: Self-pay | Source: Home / Self Care | Attending: Internal Medicine

## 2016-09-08 DIAGNOSIS — K649 Unspecified hemorrhoids: Secondary | ICD-10-CM

## 2016-09-08 DIAGNOSIS — D125 Benign neoplasm of sigmoid colon: Secondary | ICD-10-CM

## 2016-09-08 DIAGNOSIS — K5730 Diverticulosis of large intestine without perforation or abscess without bleeding: Secondary | ICD-10-CM

## 2016-09-08 DIAGNOSIS — R195 Other fecal abnormalities: Secondary | ICD-10-CM

## 2016-09-08 HISTORY — PX: COLONOSCOPY: SHX5424

## 2016-09-08 LAB — CBC
HEMATOCRIT: 30.4 % — AB (ref 39.0–52.0)
Hemoglobin: 9.5 g/dL — ABNORMAL LOW (ref 13.0–17.0)
MCH: 26.2 pg (ref 26.0–34.0)
MCHC: 31.3 g/dL (ref 30.0–36.0)
MCV: 84 fL (ref 78.0–100.0)
Platelets: 193 10*3/uL (ref 150–400)
RBC: 3.62 MIL/uL — ABNORMAL LOW (ref 4.22–5.81)
RDW: 17.2 % — ABNORMAL HIGH (ref 11.5–15.5)
WBC: 7.7 10*3/uL (ref 4.0–10.5)

## 2016-09-08 SURGERY — COLONOSCOPY
Anesthesia: Monitor Anesthesia Care

## 2016-09-08 MED ORDER — TICAGRELOR 90 MG PO TABS
90.0000 mg | ORAL_TABLET | Freq: Two times a day (BID) | ORAL | Status: DC
  Administered 2016-09-09: 90 mg via ORAL
  Filled 2016-09-08: qty 1

## 2016-09-08 MED ORDER — PROPOFOL 500 MG/50ML IV EMUL
INTRAVENOUS | Status: DC | PRN
  Administered 2016-09-08: 125 ug/kg/min via INTRAVENOUS

## 2016-09-08 MED ORDER — PANTOPRAZOLE SODIUM 40 MG PO TBEC
40.0000 mg | DELAYED_RELEASE_TABLET | Freq: Every day | ORAL | Status: DC
  Administered 2016-09-08 – 2016-09-09 (×2): 40 mg via ORAL
  Filled 2016-09-08 (×2): qty 1

## 2016-09-08 MED ORDER — LACTATED RINGERS IV SOLN
INTRAVENOUS | Status: DC
  Administered 2016-09-08: 11:00:00 via INTRAVENOUS

## 2016-09-08 MED ORDER — PROPOFOL 10 MG/ML IV BOLUS
INTRAVENOUS | Status: DC | PRN
  Administered 2016-09-08: 50 mg via INTRAVENOUS

## 2016-09-08 NOTE — Op Note (Signed)
[facility] Patient Name: Wayne Phelps Procedure Date : 09/08/2016 MRN: [id number] Attending MD: Milus Banister , MD Date of Birth: 10-Feb-1956 CSN: [id number] Age: 61 Admit Type: Inpatient Procedure:                Colonoscopy Indications:              Heme positive stool, Melena, marroon stools; EGD                            last week with Dr. Henrene Pastor was normal. Providers:                Milus Banister, MD, Zenon Mayo, RN, Lillie Fragmin, RN, Elspeth Cho Tech., Technician,                            Lance Coon, CRNA Referring MD:              Medicines:                Monitored Anesthesia Care Complications:            No immediate complications. Estimated blood loss:                            None. Estimated Blood Loss:     Estimated blood loss: none. Procedure:                Pre-Anesthesia Assessment:                           - Prior to the procedure, a History and Physical                            was performed, and patient medications and                            allergies were reviewed. The patient's tolerance of                            previous anesthesia was also reviewed. The risks                            and benefits of the procedure and the sedation                            options and risks were discussed with the patient.                            All questions were answered, and informed consent                            was obtained. Prior Anticoagulants: The patient has  taken aspirin and brilinta, last doses were 4 days                            prior to procedure. ASA Grade Assessment: IV - A                            patient with severe systemic disease that is a                            constant threat to life. After reviewing the risks                            and benefits, the patient was deemed in                            satisfactory condition to undergo  the procedure.                           After obtaining informed consent, the colonoscope                            was passed under direct vision. Throughout the                            procedure, the patient's blood pressure, pulse, and                            oxygen saturations were monitored continuously. The                            EC-[id number] ([id number]) scope was introduced through                            the anus and advanced to the the cecum, identified                            by appendiceal orifice and ileocecal valve. The                            colonoscopy was performed without difficulty. The                            patient tolerated the procedure well. The quality                            of the bowel preparation was good. The ileocecal                            valve, appendiceal orifice, and rectum were                            photographed. Scope In: 11:40:03 AM Scope Out: 11:59:16 AM Scope Withdrawal Time: 0 hours 15 minutes 4 seconds  Total Procedure Duration: 0 hours 19 minutes 13 seconds  Findings:      There was no recent or old blood in the colon.      A few small-mouthed diverticula were found in the left colon.      Internal hemorrhoids were found. The hemorrhoids were small.      A 8 mm polyp was found in the sigmoid colon. The polyp was       semi-pedunculated. The polyp was removed with a cold snare. Resection       and retrieval were complete. There was more than usual immediate post       polypectomy bleeding which I treated with two hemostatic clips (MR       conditional). Bleeding had stopped at the end of the procedure.      The exam was otherwise without abnormality on direct and retroflexion       views. Impression:               - Diverticulosis in the left colon.                           - Internal hemorrhoids.                           - One 8 mm polyp in the sigmoid colon, removed with                            a cold  snare. Resected and retrieved. Immediate                            post polypectomy bleeding was treated with                            placement of two endoclips, successfully.                           - The examination was otherwise normal on direct                            and retroflexion views. Moderate Sedation:      none Recommendation:           - Return patient to hospital ward for ongoing care.                           - Advance diet as tolerated.                           - Continue present medications. OK to resume                            anticoagulation needed for his CAD tomorrow and if                            he has recurrent significant GI bleeding please                            call. Would  possibly need further GI testing if                            that occurs (capsulte endoscopy).                           You will receive a letter within 2-3 weeks with the                            pathology results and my final recommendations                            about the poylp.                           If the polyp(s) is proven to be 'pre-cancerous' on                            pathology, you will need repeat colonoscopy in 5                            years. If the polyp(s) is NOT 'precancerous' on                            pathology then you should repeat colon cancer                            screening in 10 years with colonoscopy without need                            for colon cancer screening by any method prior to                            then (including stool testing). Procedure Code(s):        --- Professional ---                           [phone], Colonoscopy, flexible; with removal of                            tumor(s), polyp(s), or other lesion(s) by snare                            technique Diagnosis Code(s):        --- Professional ---                           K64.8, Other hemorrhoids                           D12.5, Benign neoplasm  of sigmoid colon                           R19.5, Other fecal abnormalities  K92.1, Melena (includes Hematochezia)                           K57.30, Diverticulosis of large intestine without                            perforation or abscess without bleeding CPT copyright 2016 [employer]. All rights reserved. The codes documented in this report are preliminary and upon coder review may  be revised to meet current compliance requirements. Milus Banister, MD 09/08/2016 12:08:11 PM This report has been signed electronically. Number of Addenda: 0

## 2016-09-08 NOTE — Anesthesia Preprocedure Evaluation (Signed)
Anesthesia Evaluation  Patient identified by MRN, date of birth, ID band Patient awake    Reviewed: Allergy & Precautions, NPO status , Patient's Chart, lab work & pertinent test results  Airway Mallampati: II  TM Distance: >3 FB Neck ROM: Full    Dental no notable dental hx.    Pulmonary neg pulmonary ROS,    Pulmonary exam normal breath sounds clear to auscultation       Cardiovascular hypertension, Pt. on medications and Pt. on home beta blockers + CAD, + Past MI and + Cardiac Stents  Normal cardiovascular exam(-) Valvular Problems/Murmurs Rhythm:Regular Rate:Normal  ECG: SR, rate 64     Neuro/Psych negative neurological ROS  negative psych ROS   GI/Hepatic Neg liver ROS,   Endo/Other  Morbid obesity  Renal/GU negative Renal ROS  negative genitourinary   Musculoskeletal   Abdominal (+) + obese,   Peds negative pediatric ROS (+)  Hematology  (+) Blood dyscrasia, anemia ,   Anesthesia Other Findings Super obese  Hyperlipidemia   Reproductive/Obstetrics negative OB ROS                             Anesthesia Physical  Anesthesia Plan  ASA: IV  Anesthesia Plan: MAC   Post-op Pain Management:    Induction: Intravenous  PONV Risk Score and Plan: 1 and Propofol and Treatment may vary due to age or medical condition  Airway Management Planned: Natural Airway, Nasal Cannula and Simple Face Mask  Additional Equipment:   Intra-op Plan:   Post-operative Plan:   Informed Consent: I have reviewed the patients History and Physical, chart, labs and discussed the procedure including the risks, benefits and alternatives for the proposed anesthesia with the patient or authorized representative who has indicated his/her understanding and acceptance.   Dental advisory given  Plan Discussed with: CRNA  Anesthesia Plan Comments:         Anesthesia Quick Evaluation

## 2016-09-08 NOTE — Anesthesia Procedure Notes (Signed)
Procedure Name: MAC Date/Time: 09/08/2016 11:33 AM Performed by: Lance Coon Pre-anesthesia Checklist: Patient identified, Emergency Drugs available, Suction available, Patient being monitored and Timeout performed Patient Re-evaluated:Patient Re-evaluated prior to inductionOxygen Delivery Method: Nasal cannula

## 2016-09-08 NOTE — Interval H&P Note (Signed)
History and Physical Interval Note:  09/08/2016 10:55 AM  Wayne Phelps  has presented today for surgery, with the diagnosis of History of bleeding per rectum, anemia.  The various methods of treatment have been discussed with the patient and family. After consideration of risks, benefits and other options for treatment, the patient has consented to  Procedure(s): COLONOSCOPY (N/A) as a surgical intervention .  The patient's history has been reviewed, patient examined, no change in status, stable for surgery.  I have reviewed the patient's chart and labs.  Questions were answered to the patient's satisfaction.     Milus Banister

## 2016-09-08 NOTE — H&P (View-Only) (Signed)
   Patient Name: Wayne Phelps Date of Encounter: 09/07/2016, 8:45 AM    Subjective  For colonoscopy tomorrow No new c/o  Objective  BP (!) 184/100 (BP Location: Left Arm)   Pulse 77   Temp 98.4 F (36.9 C) (Oral)   Resp 16   Ht 5\' 10"  (1.778 m)   Wt (!) 358 lb (162.4 kg)   SpO2 95%   BMI 51.37 kg/m  NAD  CBC Latest Ref Rng & Units 09/06/2016 09/05/2016 09/04/2016  WBC 4.0 - 10.5 K/uL 6.9 7.6 6.9  Hemoglobin 13.0 - 17.0 g/dL 9.2(L) 8.8(L) 9.3(L)  Hematocrit 39.0 - 52.0 % 30.3(L) 29.3(L) 29.9(L)  Platelets 150 - 400 K/uL 193 203 198       Assessment and Plan   Heme+ anemia , hx reportedmelna/marroon stools - GI bleeding recent DES for non-STEMI last Brillinta and ASA about 6/27  Colonoscopy tomorrow - Dr. Janeece Fitting, MD, [facility] [phone] (pager) [phone] after 5 PM, weekends and holidays  09/07/2016 8:45 AM

## 2016-09-08 NOTE — Progress Notes (Signed)
° °  Subjective:  He is having formed bowel movements without any visible blood. He denies abdominal pain. He underwent EGD on 6/29 that did not identify any source of GI blood loss. He received bowl prep on 09/07/16 with a rectal tube placed. Unfortunately the rectal tube failed and had to be removed, however the patient was able to tolerate the prep without the rectal tube with frequent bed cleanings.  His colonoscopy is scheduled for 9:30am today.  Objective:  Vital signs in last 24 hours: Vitals:   09/07/16 2159 09/08/16 0414 09/08/16 1053 09/08/16 1210  BP: (!) 177/86 (!) 158/98 (!) 173/70 128/63  Pulse: 83 77 79 92  Resp: 19 16 17 15   Temp: 98.1 F (36.7 C) 97.6 F (36.4 C) 98 F (36.7 C)   TempSrc: Oral Oral Oral   SpO2: 95% 97% 97% 95%  Weight:   (!) 358 lb (162.4 kg)   Height:   5\' 10"  (1.778 m)    Physical Exam  Constitutional: He is well-developed, well-nourished, and in no distress.  Cardiovascular: Normal rate, regular rhythm and normal heart sounds.  Exam reveals no gallop and no friction rub.   No murmur heard. Pulmonary/Chest: Effort normal and breath sounds normal.  Abdominal: Soft. Bowel sounds are normal. There is no tenderness.  Musculoskeletal:  Stasis skin changes over right medial leg  Skin: Skin is warm and dry.      Assessment/Plan: 61 y/o man with a history of recent NSTEMI complicated by femoral artery laceration during LHC, HTN, urinary retention, morbid obesity, and recently treated C. Diff who came from SNF due to new onset of both bright red and dark rectal bleeding.  GI bleed. His hemoglobin stable on 7/2 at 9.5 and he has not seen any more bright red or maroon colored stools. - EGD on 6/29 was unremarkable - Colonoscopy on 7/2 showed L colon diverticulosis, Internal hemorrhoids, and one 8 mm polyp that was removed and received two endo clips to control bleeding - Resume Brilinta per GI recommendations (will contact GI for any significant recurrent  GI bleed) - Continue ASA as well as prophylactic dose anticoagulation while here and bedbound.   History of NSTEMI Right femoral wound Severe deconditioning Patient had DES placement to RCA 06/2016. During left heart cath on 06/2015 patient's right femoral artery was lacerated. It has been slow to heal with complications but currently feels okay with wound care managing dressings. He was on DAPT for this but currently held to ASA alone as above. He was undergoing rehabilitation prior to this admission. - Consult SW to arrange discharge to rehab facility  Hypertension BP range 098-119 Systolic.  - On 5mg  Hydralazine IV PRN if SBP >180  - Scheduled 50mg  Hydralazine q8Hours added on 6/30.  History of Clostridium difficile  07/28/2016 patient had positive c.diff antigen and watery stools that resolved at treatment with PO vancomycin. Currently asymptomatic and having formed stools.  Urinary retention  His foley catheter is planned to remain until follow-up on July 9th with urology. He has no associated complaints at this time.  Dispo: Anticipated discharge in 1 day.  Pearson Grippe, DO IM PGY-1 Pager: [phone] 09/08/2016, 12:28 PM

## 2016-09-08 NOTE — Transfer of Care (Signed)
Immediate Anesthesia Transfer of Care Note  Patient: Wayne Phelps  Procedure(s) Performed: Procedure(s): COLONOSCOPY (N/A)  Patient Location: Endoscopy Unit  Anesthesia Type:MAC  Level of Consciousness: awake, oriented and patient cooperative  Airway & Oxygen Therapy: Patient Spontanous Breathing  Post-op Assessment: Report given to RN and Post -op Vital signs reviewed and stable  Post vital signs: Reviewed and stable  Last Vitals:  Vitals:   09/08/16 0414 09/08/16 1053  BP: (!) 158/98 (!) 173/70  Pulse: 77 79  Resp: 16 17  Temp: 36.4 C 36.7 C    Last Pain:  Vitals:   09/08/16 1053  TempSrc: Oral  PainSc:       Patients Stated Pain Goal: 0 (67/70/34 0352)  Complications: No apparent anesthesia complications

## 2016-09-08 NOTE — Progress Notes (Signed)
  Date: 09/08/2016  Patient name: Wayne Phelps  Medical record number: [id number]  Date of birth: 03-20-1955   I have seen and evaluated this patient and I have discussed the plan of care with the house staff. Please see their note for complete details. I concur with their findings with the following additions/corrections: Mr Kollmann was seen on morning rounds. He completed his prep last night. He has since had his colonoscopy which did not show any etiology for his GI bleed. His hemoglobin has been stable. GI feels it is safe to resume his DOAC on July 3. If he has any recurrent bleeding he will need further investigation likely with a capsule endoscopy. DC today or tomorrow.  Bartholomew Crews, MD 09/08/2016, 2:42 PM

## 2016-09-09 DIAGNOSIS — R710 Precipitous drop in hematocrit: Secondary | ICD-10-CM

## 2016-09-09 LAB — CBC
HCT: 31.7 % — ABNORMAL LOW (ref 39.0–52.0)
Hemoglobin: 9.7 g/dL — ABNORMAL LOW (ref 13.0–17.0)
MCH: 26.4 pg (ref 26.0–34.0)
MCHC: 30.6 g/dL (ref 30.0–36.0)
MCV: 86.1 fL (ref 78.0–100.0)
PLATELETS: 205 10*3/uL (ref 150–400)
RBC: 3.68 MIL/uL — ABNORMAL LOW (ref 4.22–5.81)
RDW: 17.5 % — AB (ref 11.5–15.5)
WBC: 7.6 10*3/uL (ref 4.0–10.5)

## 2016-09-09 MED ORDER — HYDRALAZINE HCL 50 MG PO TABS: 50.0000 mg | ORAL_TABLET | Freq: Three times a day (TID) | ORAL | Status: AC

## 2016-09-09 MED ORDER — PANTOPRAZOLE SODIUM 40 MG PO TBEC: 40.0000 mg | DELAYED_RELEASE_TABLET | Freq: Every day | ORAL | Status: AC

## 2016-09-09 NOTE — Progress Notes (Signed)
AVS reviewed with patient, report called to RN at SNF, IV removed

## 2016-09-09 NOTE — Clinical Social Work Note (Signed)
Patient medically stable for discharge and will return to [facility], transported by ambulance.  Admissions director, Santiago Glad with [facility] submitted for authorization with Christella Scheuermann this morning, but authorization not rec'd. Facility accepting 5-day LOG. Sister, Blase Mess - [phone] contacted and informed of discharge and ambulance transport.  Arial Galligan Givens, MSW, LCSW Licensed Clinical Social Worker [place] [phone]

## 2016-09-09 NOTE — Discharge Summary (Signed)
Name: Wayne Phelps MRN: [id number] DOB: February 26, 1956 61 y.o. PCP: System, Pcp Not In  Date of Admission: 09/03/2016 12:00 PM Date of Discharge: 09/09/2016 Attending Physician: Bartholomew Crews, MD  Discharge Diagnosis: Principal Problem:   Rectal bleeding Active Problems:   Hx of non-ST elevation myocardial infarction (NSTEMI)   Urinary retention   Melena   Heme + stool   Decreased hemoglobin   Benign neoplasm of sigmoid colon   Hemorrhoids   Diverticulosis of colon without hemorrhage   Discharge Medications: Allergies as of 09/09/2016   No Known Allergies     Medication List    STOP taking these medications   famotidine 20 MG tablet Commonly known as:  PEPCID     TAKE these medications   acetaminophen 325 MG tablet Commonly known as:  TYLENOL Take 650 mg by mouth every 6 (six) hours as needed (for pain).   amiodarone 100 MG tablet Commonly known as:  PACERONE Take 100 mg by mouth daily.   aspirin EC 81 MG tablet Take 81 mg by mouth daily.   atorvastatin 40 MG tablet Commonly known as:  LIPITOR Take 40 mg by mouth at bedtime.   hydrALAZINE 50 MG tablet Commonly known as:  APRESOLINE Take 1 tablet (50 mg total) by mouth every 8 (eight) hours. What changed:  when to take this   HYDROcodone-acetaminophen 5-325 MG tablet Commonly known as:  NORCO/VICODIN Take 1 tablet by mouth every 8 (eight) hours as needed (for pain).   metoprolol tartrate 25 MG tablet Commonly known as:  LOPRESSOR Take 25 mg by mouth 2 (two) times daily.   pantoprazole 40 MG tablet Commonly known as:  PROTONIX Take 1 tablet (40 mg total) by mouth daily. Start taking on:  09/10/2016   sodium hypochlorite 0.125 % Soln Commonly known as:  DAKIN'S 1/4 STRENGTH Irrigate with 1 application as directed See admin instructions. FOR WOUND CARE- GROIN AREA   tamsulosin 0.4 MG Caps capsule Commonly known as:  FLOMAX Take 0.4 mg by mouth daily.   ticagrelor 90 MG Tabs tablet Commonly  known as:  BRILINTA Take 90 mg by mouth 2 (two) times daily.   VOLTAREN 1 % Gel Generic drug:  diclofenac sodium Apply 2 g topically every 8 (eight) hours. TO AFFECTED AREA FOR 21 DAYS       Disposition and follow-up:   Mr.Wayne Phelps was discharged from [facility] in Good condition.  At the hospital follow up visit please address:  1.  GI bleeding: Please monitor for any recurrence of bright rectal bleeding or bloody stools. He was switched to daily PPI therapy from an H2 blocker during this admission. He would need capsul endoscopy for evaluation of any new clinically significant GI bleedings.  2.  Urinary retention: He was discharged with indwelling foley catheter until planned urology clinic evaluation on 09/15/16.  3.  Right groin wound: Clean dry and well healing wound over right femoral artery site, continue wound care.  4.  Labs / imaging needed at time of follow-up: none  5.  Pending labs/ test needing follow-up: none  Follow-up Appointments:   Hospital Course by problem list: Rectal bleeding Admitted for up to one week of loose stools and one day of frank bloody bowel movements. This was felt to be worsened with dual antiplatelet therapy and brilinta held on admission. He remained hemodynamically stable throughout the admission without requiring transfusion. Upper endoscopy performed 6/29 showed no bleeding source but he remained inpatient for  lower GI bleeding rule out due to high risk of withholding DAPT after recent stenting. Colonoscopy on 7/2 showed diverticulosis, a single large polyp which was a benign tubular adenoma on pathology, and internal hemorrhoids none of which were definitively causative. He was resumed on brilinta prior to discharge.  Hx of non-ST elevation myocardial infarction (NSTEMI) Cardiology was consulted regarding DAPT treatment for DES placement to RCA 06/2016 and we resumed treatment on brilinta after a 5 day interruption  preoperatively for colonoscopy. He tolerated procedures without intraoperative events.  Urinary retention He came from rehab facility with indwelling urinary catheter which was continued during the admission here.  Anemia Hemoglobin remained stable within range of 8-10, unchanged from prior to admission.  Right groin wound Wound site was managed with simple foam adhesive dressing changes.  Discharge Vitals:   BP (!) 170/85 (BP Location: Right Arm)   Pulse 71   Temp 98 F (36.7 C) (Oral)   Resp 20   Ht 5\' 10"  (1.778 m)   Wt (!) 358 lb (162.4 kg)   SpO2 97%   BMI 51.37 kg/m   Pertinent Labs, Studies, and Procedures:  09/05/16 Upper Endoscopy: Findings: The stomach was normal. The examined duodenum was normal. The cardia and gastric fundus were normal on retroflexion. Impression: - Normal EGD - No cause for bleeding found. Rule out lower GI source  09/08/16 Colonoscopy: Findings: There was no recent or old blood in the colon. A few small-mouthed diverticula were found in the left colon. Internal hemorrhoids were found. The hemorrhoids were small. A 8 mm polyp was found in the sigmoid colon. The polyp was semi-pedunculated. The polyp was removed with a cold snare. Resection and retrieval were complete. There was more than usual immediate post polypectomy bleeding which I treated with two hemostatic clips (MR conditional). Bleeding had stopped at the end of the procedure. Impression: - Diverticulosis in the left colon. - Internal hemorrhoids. - One 8 mm polyp in the sigmoid colon, removed with a cold snare. Resected and retrieved. Immediate post polypectomy bleeding was treated with placement of two endoclips, successfully. - The examination was otherwise normal on direct and retroflexion views.   Discharge Instructions: Discharge Instructions    Diet - low sodium heart healthy    Complete by:  As directed    Discharge instructions    Complete by:  As directed    You  have been restarted on brilinta. We do not need any additional testing at this time and there were no sources of bleeding seen.  If you start to notice significant bleeding again we may need to do an additional test to look at the remaining portion of your digestive tract not seen on endoscopy or colonoscopy. Please report any large or frequent blood loss you see.   Increase activity slowly    Complete by:  As directed       Signed: Collier Salina, MD PGY-III Internal Medicine Resident Pager# [phone] 09/09/2016, 2:09 PM

## 2016-09-09 NOTE — Clinical Social Work Note (Signed)
Clinical Social Work Assessment  Patient Details  Name: Wayne Phelps MRN: [id number] Date of Birth: Aug 08, 1955  Date of referral:  09/09/16               Reason for consult:  Facility Placement                Permission sought to share information with:  Family Supports Permission granted to share information::  Yes, Verbal Permission Granted  Name::     Blase Mess  Agency::     Relationship::  Sister  Contact Information:  [phone]  Housing/Transportation Living arrangements for the past 2 months:  [place] (Patient transferred from [facility] to [facility] in June 2018) Source of Information:  Patient Patient Interpreter Needed:  None Criminal Activity/Legal Involvement Pertinent to Current Situation/Hospitalization:  No - Comment as needed Significant Relationships:  Siblings (Patient's sister Rosann Auerbach lives in [place], [place] ) Lives with:  Self Do you feel safe going back to the place where you live?  No (Patient agreeable to discharging back to [facility] to continue his rehab) Need for family participation in patient care:  No (Coment)  Care giving concerns:  Patient expressed no concerns regarding care at [facility]).  Social Worker assessment / plan:  CSW talked with patient at the bedside regarding his discharge plan and confirmed return to [facility] to continue his rehab. Patient indicated that he discharged from [facility] in June and went to [facility]. When asked, patient reported that he has never married and has no children. He has a sister, Rosann Auerbach that lives in [place], and talks with her daily.  Employment status:  Disabled (Comment on whether or not currently receiving Disability) Insurance information:  Managed Care ([place]) PT Recommendations:  [place] / Referral to community resources:  [place] (None needed or requested as patient will return to  [facility])  Patient/Family's Response to care:  No concerns expressed by patient regarding care during hospitalization.  Patient/Family's Understanding of and Emotional Response to Diagnosis, Current Treatment, and Prognosis:  Not discussed.  Emotional Assessment Appearance:  Appears older than stated age Attitude/Demeanor/Rapport:  Other (Appropriate) Affect (typically observed):  Pleasant, Appropriate Orientation:  Oriented to Self, Oriented to Place, Oriented to  Time, Oriented to Situation Alcohol / Substance use:  Tobacco Use, Alcohol Use, Illicit Drugs (Patient reports that he does not smoke, drink alcohol or use illicit drugs) Psych involvement (Current and /or in the community):  No (Comment)  Discharge Needs  Concerns to be addressed:  Discharge Planning Concerns Readmission within the last 30 days:  No Current discharge risk:  None Barriers to Discharge:  No Barriers Identified   Sable Feil, LCSW 09/09/2016, 11:25 AM

## 2016-09-09 NOTE — Progress Notes (Signed)
[facility] Progress Note    Since last GI note: Colonoscopy yesterday: small polyp (removed), diverticulosis and hemorrhoids.  Slept well overnight.  Ate regular dinner (heart healthy).  No abd pain, no overt GI bleeding  Objective: Vital signs in last 24 hours: Temp:  [98 F (36.7 C)-99 F (37.2 C)] 98 F (36.7 C) (07/03 0618) Pulse Rate:  [67-92] 71 (07/03 0618) Resp:  [15-20] 20 (07/03 0618) BP: (114-178)/(63-101) 170/85 (07/03 0618) SpO2:  [95 %-98 %] 97 % (07/03 0618) Weight:  [358 lb (162.4 kg)] 358 lb (162.4 kg) (07/02 1053) Last BM Date: 09/08/16 General: alert and oriented times 3 Heart: regular rate and rythm Abdomen: soft, non-tender, non-distended, normal bowel sounds   Lab Results:  Recent Labs  09/08/16 0640  WBC 7.7  HGB 9.5*  PLT 193  MCV 84.0    Medications: Scheduled Meds: . amiodarone  100 mg Oral Daily  . aspirin  81 mg Oral Daily  . atorvastatin  40 mg Oral QHS  . diclofenac sodium  2 g Topical Q8H  . enoxaparin (LOVENOX) injection  80 mg Subcutaneous Q24H  . hydrALAZINE  50 mg Oral Q8H  . pantoprazole  40 mg Oral Daily  . ticagrelor  90 mg Oral BID   Continuous Infusions: PRN Meds:.acetaminophen, diphenhydrAMINE, hydrALAZINE    Assessment/Plan: 61 y.o. male admitted with melena, heme positive stools, anemia  EGD was normal, colonoscopy found diverticulosis, hemorrhoids and a single subCM polyp that was removed.  Unclear if any of these were the source of his bleeding which has stopped despite daily lovenox and continuation of daily ASA. OK to resume his brilinta.  If any further overt GI bleeding please call or page. Would at that point need to consider capsule endoscopy.     Milus Banister, MD  09/09/2016, 7:07 AM [facility] Pager [phone]

## 2016-09-09 NOTE — NC FL2 (Signed)
[place] LEVEL OF CARE SCREENING TOOL     IDENTIFICATION  Patient Name: Wayne Phelps Birthdate: 09/24/1955 Sex: male Admission Date (Current Location): 09/03/2016  [facility] and [place] Number:  Herbalist and Address:  [facility] [place] [place] [address] [place], [place] [address]      Provider Number: [id number]  Attending Physician Name and Address:  Bartholomew Crews, MD  Relative Name and Phone Number:  Blase Mess - [phone]    Current Level of Care: Hospital Recommended Level of Care: [place] (Patient from [facility]) Prior Approval Number:    Date Approved/Denied:   PASRR Number: [id number]  Discharge Plan: SNF    Current Diagnoses: Patient Active Problem List   Diagnosis Date Noted  . Benign neoplasm of sigmoid colon   . Hemorrhoids   . Diverticulosis of colon without hemorrhage   . Heme + stool   . Decreased hemoglobin   . Melena   . Rectal bleeding 09/03/2016  . Hx of non-ST elevation myocardial infarction (NSTEMI) 09/03/2016  . Urinary retention 09/03/2016    Orientation RESPIRATION BLADDER Height & Weight     Self, Time, Situation, Place  Normal Continent (Urinary catheter placed 6/27) Weight: (!) 358 lb (162.4 kg) Height:  5\' 10"  (177.8 cm)  BEHAVIORAL SYMPTOMS/MOOD NEUROLOGICAL BOWEL NUTRITION STATUS      Continent Diet (Heart healthy)  AMBULATORY STATUS COMMUNICATION OF NEEDS Skin   Total assist  Verbal Other (Comment) (Wound to right groin)                       Personal Care Assistance Level of Assistance  Bathing, Feeding, Dressing   Feeding assistance: Independent  Bathing: Upper body assistance with set-up; lower body mod assist. Dressing: Upper body assistance with set-up; lower body max assist.     Functional Limitations Info   Sight Hearing Speech  Adequate   Adequate  Adequate    SPECIAL CARE FACTORS FREQUENCY  PT (By licensed PT), OT  (By licensed OT)  Evaluated by PT on 6/30 and a minimum of 2X per week therapy recommended.  Evaluated by OT on 7/3 and a minimum of 2X per week therapy recommended.                Contractures Contractures Info: Not present    Additional Factors Info  Code Status, Allergies, Isolation Precautions Code Status Info: Full Allergies Info: No known allergies           Current Medications (09/09/2016):  This is the current hospital active medication list Current Facility-Administered Medications  Medication Dose Route Frequency Provider Last Rate Last Dose  . acetaminophen (TYLENOL) tablet 650 mg  650 mg Oral Q6H PRN Maryellen Pile, MD   650 mg at 09/08/16 2155  . amiodarone (PACERONE) tablet 100 mg  100 mg Oral Daily Rivet, Carly J, MD   100 mg at 09/09/16 1016  . aspirin chewable tablet 81 mg  81 mg Oral Daily Kalman Shan Ratliff, DO   81 mg at 09/09/16 1016  . atorvastatin (LIPITOR) tablet 40 mg  40 mg Oral QHS Rivet, Carly J, MD   40 mg at 09/08/16 2154  . diclofenac sodium (VOLTAREN) 1 % transdermal gel 2 g  2 g Topical Q8H Rivet, Carly J, MD   2 g at 09/08/16 2157  . diphenhydrAMINE (BENADRYL) capsule 25 mg  25 mg Oral Q6H PRN Maryellen Pile, MD   25  mg at 09/08/16 2154  . enoxaparin (LOVENOX) injection 80 mg  80 mg Subcutaneous Q24H Hoffman, Jessica Ratliff, DO   80 mg at 09/08/16 1339  . hydrALAZINE (APRESOLINE) injection 5 mg  5 mg Intravenous Q6H PRN Maryellen Pile, MD   5 mg at 09/05/16 2330  . hydrALAZINE (APRESOLINE) tablet 50 mg  50 mg Oral Q8H Rice, Resa Miner, MD   50 mg at 09/08/16 2155  . pantoprazole (PROTONIX) EC tablet 40 mg  40 mg Oral Daily Vena Rua, PA-C   40 mg at 09/09/16 1016  . ticagrelor (BRILINTA) tablet 90 mg  90 mg Oral BID Collier Salina, MD   90 mg at 09/09/16 1016     Discharge Medications: Please see discharge summary for a list of discharge medications.  Relevant Imaging Results:  Relevant Lab Results:   Additional  Information [id number].   Sable Feil, LCSW

## 2016-09-09 NOTE — Progress Notes (Signed)
   Subjective:  Wayne Phelps is feeling well today. He denies abdominal pain. He underwent EGD on 6/29 and colonoscopy on 09/09/16 that did not identify any source of GI blood loss. The patient was informed that the colonoscopy did reveal an 7mm polyp, which has been sent to pathology. He states the colonoscopy went well and he is ready to return to his rehab facility.  Objective:  Vital signs in last 24 hours: Vitals:   09/08/16 1340 09/08/16 1809 09/08/16 2012 09/09/16 0618  BP: (!) 178/101 114/70 (!) 163/91 (!) 170/85  Pulse:  75 88 71  Resp:  20 20 20   Temp:  98.7 F (37.1 C) 99 F (37.2 C) 98 F (36.7 C)  TempSrc:  Oral Oral Oral  SpO2:  97% 97% 97%  Weight:      Height:       Physical Exam  Constitutional: He is oriented to person, place, and time and well-developed, well-nourished, and in no distress.  Cardiovascular: Normal rate, regular rhythm and normal heart sounds.  Exam reveals no gallop and no friction rub.   No murmur heard. Pulmonary/Chest: Effort normal and breath sounds normal.  Abdominal: Soft. Bowel sounds are normal. There is no tenderness.  Musculoskeletal: He exhibits no edema or deformity.  Stasis skin changes over right medial leg  Neurological: He is alert and oriented to person, place, and time.  Skin: Skin is warm and dry.      Assessment/Plan: 61 y/o man with a history of recent NSTEMI complicated by femoral artery laceration during LHC, HTN, urinary retention, morbid obesity, and recently treated C. Diff who came from SNF due to new onset of both bright red and dark rectal bleeding.  GI bleed. His hemoglobin stable on 7/2 at 9.5 and he has not seen any more bright red or maroon colored stools. - EGD on 6/29 was unremarkable - Colonoscopy on 7/2 did not reveal a source of bleeding  - It did show show L colon diverticulosis, Internal hemorrhoids, and one 8 mm polyp that was removed with path pending - Restarted Brilinta  - Continued ASA as well as  prophylactic dose anticoagulation while here and bedbound.   History of NSTEMI Right femoral wound Severe deconditioning Patient had DES placement to RCA 06/2016. During left heart cath on 06/2015 patient's right femoral artery was lacerated. It has been slow to heal with complications but currently feels okay with wound care managing dressings. He was on DAPT for this but currently held to ASA alone as above. He was undergoing rehabilitation prior to this admission. - PT/OT for Discharge - Arrange discharge to rehab facility  Hypertension BP range 856-314 Systolic.  - On 5mg  Hydralazine IV PRN if SBP >180  - Scheduled 50mg  Hydralazine q8Hours added on 6/30.  History of Clostridium difficile  07/28/2016 patient had positive c.diff antigen and watery stools that resolved at treatment with PO vancomycin. Currently asymptomatic and having formed stools.  Urinary retention  His foley catheter is planned to remain until follow-up on July 9th with urology. He has no associated complaints at this time.  DVT PPx: Lovenox  Dispo: Anticipated discharge Today.  Pearson Grippe, DO IM PGY-1 Pager: [phone] 09/09/2016, 8:30 AM

## 2016-09-09 NOTE — Progress Notes (Signed)
Physical Therapy Treatment Patient Details Name: Wayne Phelps MRN: [id number] DOB: 1955-09-19 Today's Date: 09/09/2016    History of Present Illness Pt is a 61 yo male admitted through ED on 09/03/16 with possible GI bleed. Pt underwent an endoscopy on 6/29 with no significant findings and now awaits a colonoscopy. PMH significant for NSTEMI 06/13/16 with a common femoral artery injury requiring surgical repair and pt went into hemorrhagic shock requiring intubation and vent placement. Pt discharged to [facility] 07/09/16 on vent and recently discharged to [place] s/p 3 weeks for rehab. No other significant PMH on file except left reverse shoulder replacement.      PT Comments    Continuing work on functional mobility and activity tolerance;  Noting very good improvement from last session, needing less assist with all aspects of bed mobility; he is clearly motivated to get better, and we discussed what to expect rehab-wise; Overall progressing well; Anticipate continuing good progress at post-acute rehabilitation, even if that progress is slower than Wayne Phelps would like   Follow Up Recommendations  SNF;Supervision/Assistance - 24 hour     Equipment Recommendations  None recommended by PT    Recommendations for Other Services       Precautions / Restrictions Precautions Precautions: Fall Restrictions Weight Bearing Restrictions: No    Mobility  Bed Mobility Overal bed mobility: Needs Assistance Bed Mobility: Supine to Sit;Sit to Supine;Rolling Rolling: Mod assist   Supine to sit: Mod assist;+2 for safety/equipment Sit to supine: Mod assist;+2 for physical assistance   General bed mobility comments: Good initiation and use of rails for rolling; heavy mod assist to complete roll into sidelying; Light mod assist to help LEs Clear EOB and elevate trunk to sit; heavy use of rails; Heavy mod assist to help LEs back onto bed  Transfers Overall transfer level: Needs  assistance   Transfers: Lateral/Scoot Transfers          Lateral/Scoot Transfers: +2 physical assistance;Max assist General transfer comment: Simulated lateral scoot transfers by scooting towards HOB; cues for technqiue and weight shift; good initiation; still with need for +2 assist and inefficient scoot  Ambulation/Gait                 Stairs            Wheelchair Mobility    Modified Rankin (Stroke Patients Only)       Balance   Sitting-balance support: No upper extremity supported;Feet supported Sitting balance-Leahy Scale: Good Sitting balance - Comments: Able to sit unsupported EOB to perform UE therex with theraband, as well as alternating long arc quads                                    Cognition Arousal/Alertness: Awake/alert Behavior During Therapy: WFL for tasks assessed/performed Overall Cognitive Status: Within Functional Limits for tasks assessed                                        Exercises General Exercises - Lower Extremity Long Arc Quad: AROM;Both;10 reps;Seated Straight Leg Raises: AROM;Right;Left;10 reps;Supine (alternating)    General Comments        Pertinent Vitals/Pain Pain Assessment: 0-10 Pain Score: 2  Pain Location: Sore at proximal R anterior thigh Pain Descriptors / Indicators: Sore Pain Intervention(s): Monitored during session    Home  Living Family/patient expects to be discharged to:: (P) Skilled nursing facility                    Prior Function Level of Independence: (P) Needs assistance      Comments: (P) pt was at rehab and working on ADLs.  Has not been independent for a couple of months   PT Goals (current goals can now be found in the care plan section) Acute Rehab PT Goals Patient Stated Goal: to get back to rehab for recovery PT Goal Formulation: With patient Time For Goal Achievement: 09/20/16 Potential to Achieve Goals: Fair Progress towards PT goals:  Progressing toward goals    Frequency    Min 2X/week      PT Plan Current plan remains appropriate    Co-evaluation PT/OT/SLP Co-Evaluation/Treatment: Yes Reason for Co-Treatment: For patient/therapist safety PT goals addressed during session: Mobility/safety with mobility OT goals addressed during session: ADL's and self-care;Strengthening/ROM      AM-PAC PT "6 Clicks" Daily Activity  Outcome Measure  Difficulty turning over in bed (including adjusting bedclothes, sheets and blankets)?: Total Difficulty moving from lying on back to sitting on the side of the bed? : Total Difficulty sitting down on and standing up from a chair with arms (e.g., wheelchair, bedside commode, etc,.)?: Total Help needed moving to and from a bed to chair (including a wheelchair)?: Total Help needed walking in hospital room?: Total Help needed climbing 3-5 steps with a railing? : Total 6 Click Score: 6    End of Session Equipment Utilized During Treatment:  (bed pad, theraband) Activity Tolerance: Patient tolerated treatment well Patient left: in bed;with call bell/phone within reach Nurse Communication: Mobility status PT Visit Diagnosis: Unsteadiness on feet (R26.81);Muscle weakness (generalized) (M62.81)     Time: 5929-2446 PT Time Calculation (min) (ACUTE ONLY): 34 min  Charges:  $Therapeutic Activity: 8-22 mins                    G Codes:       Roney Marion, PT  Acute Rehabilitation Services Pager [phone] Office [phone]    Colletta [place] 09/09/2016, 12:36 PM

## 2016-09-09 NOTE — Evaluation (Signed)
Occupational Therapy Evaluation Patient Details Name: Wayne Phelps MRN: [id number] DOB: 02/22/1956 Today's Date: 09/09/2016    History of Present Illness Pt is a 61 yo male admitted through ED on 09/03/16 with possible GI bleed. Pt underwent an endoscopy on 6/29 with no significant findings and now awaits a colonoscopy. PMH significant for NSTEMI 06/13/16 with a common femoral artery injury requiring surgical repair and pt went into hemorrhagic shock requiring intubation and vent placement. Pt discharged to [facility] 07/09/16 on vent and recently discharged to [place] s/p 3 weeks for rehab. No other significant PMH on file except left reverse shoulder replacement.     Clinical Impression   Pt was admitted for the above. He is very motivated and was receiving rehab at [facility] prior to admission.  He will benefit from continued OT in acute as well as follow up at [facility]. Pt currently needs mod +2 assist for bed mobility and he has been working on sliding board transfers. Goals in acute are for min+2 assist.    Follow Up Recommendations  SNF    Equipment Recommendations   (defer to next venue)    Recommendations for Other Services       Precautions / Restrictions Precautions Precautions: Fall Restrictions Weight Bearing Restrictions: No      Mobility Bed Mobility Overal bed mobility: Needs Assistance Bed Mobility: Supine to Sit;Sit to Supine;Rolling Rolling: Mod assist   Supine to sit: Mod assist;+2 for safety/equipment Sit to supine: Mod assist;+2 for physical assistance   General bed mobility comments: Good initiation and use of rails for rolling; heavy mod assist to complete roll into sidelying; Light mod assist to help LEs Clear EOB and elevate trunk to sit; heavy use of rails; Heavy mod assist to help LEs back onto bed  Transfers Overall transfer level: Needs assistance   Transfers: Lateral/Scoot Transfers          Lateral/Scoot Transfers: +2 physical assistance;Max  assist General transfer comment: Simulated lateral scoot transfers by scooting towards HOB; cues for technqiue and weight shift; good initiation; still with need for +2 assist and inefficient scoot    Balance   Sitting-balance support: No upper extremity supported;Feet supported Sitting balance-Leahy Scale: Good Sitting balance - Comments: Able to sit unsupported EOB to perform UE therex with theraband, as well as alternating long arc quads                                   ADL either performed or assessed with clinical judgement   ADL Overall ADL's : Needs assistance/impaired Eating/Feeding: Independent   Grooming: Set up;Sitting   Upper Body Bathing: Set up;Sitting   Lower Body Bathing: Moderate assistance;+2 for physical assistance;Bed level   Upper Body Dressing : Set up;Sitting   Lower Body Dressing: Maximal assistance;+2 for physical assistance;Bed level                 General ADL Comments: pt has been working with a Secondary school teacher at [employer].  Unable to stand; LB adls from bed level.  Issued level 2 theraband.  Pt sat EOB and performed exercises. See below     Vision         Perception     Praxis      Pertinent Vitals/Pain Pain Assessment: 0-10 Pain Score: 2  Pain Location: Sore at proximal R anterior thigh Pain Descriptors / Indicators: Sore Pain Intervention(s): Monitored during session  Hand Dominance     Extremity/Trunk Assessment Upper Extremity Assessment Upper Extremity Assessment: LUE deficits/detail LUE Deficits / Details: tight at end range; pt reports TSA.  Strength grossly 4-/5           Communication Communication Communication: No difficulties   Cognition Arousal/Alertness: Awake/alert Behavior During Therapy: WFL for tasks assessed/performed Overall Cognitive Status: Within Functional Limits for tasks assessed                                     General Comments       Exercises  Other  Exercises Other Exercises: level 2 theraband.  one set of 20:  horizontal abduction; biceps and triceps.  Cued to decrease resistance on L and to increase reps as he was trying to perform exercises with too much resistance   Shoulder Instructions      Home Living Family/patient expects to be discharged to:: Skilled nursing facility                                        Prior Functioning/Environment Level of Independence: Needs assistance        Comments: pt was at rehab and working on ADLs.  Has not been independent for a couple of months        OT Problem List: Decreased strength;Decreased activity tolerance;Pain;Decreased knowledge of use of DME or AE      OT Treatment/Interventions: Self-care/ADL training;Patient/family education;Energy conservation;DME and/or AE instruction;Therapeutic activities    OT Goals(Current goals can be found in the care plan section) Acute Rehab OT Goals Patient Stated Goal: to get back to rehab for recovery OT Goal Formulation: With patient Time For Goal Achievement: 09/16/16 Potential to Achieve Goals: Good ADL Goals Pt Will Perform Lower Body Bathing: with min assist;bed level;with adaptive equipment (2 assist) Pt Will Perform Lower Body Dressing: with min assist;with adaptive equipment;bed level (2 assist) Pt Will Transfer to Toilet: with min assist;with +2 assist;bedside commode;with transfer board Additional ADL Goal #1: pt will perform bed mobility at min +2 assist level Additional ADL Goal #2: pt will be independent with theraband program (level 2 and 3)  OT Frequency: Min 2X/week   Barriers to D/C:            Co-evaluation PT/OT/SLP Co-Evaluation/Treatment: Yes Reason for Co-Treatment: For patient/therapist safety PT goals addressed during session: Mobility/safety with mobility (Simultaneous filing. User may not have seen previous data.) OT goals addressed during session: ADL's and self-care;Strengthening/ROM       AM-PAC PT "6 Clicks" Daily Activity     Outcome Measure Help from another person eating meals?: None Help from another person taking care of personal grooming?: A Little Help from another person toileting, which includes using toliet, bedpan, or urinal?: A Lot Help from another person bathing (including washing, rinsing, drying)?: A Little Help from another person to put on and taking off regular upper body clothing?: A Little Help from another person to put on and taking off regular lower body clothing?: A Lot 6 Click Score: 17   End of Session    Activity Tolerance: Patient tolerated treatment well Patient left: in bed;with call bell/phone within reach  OT Visit Diagnosis: Muscle weakness (generalized) (M62.81)                Time: 8270-7867 OT Time Calculation (  min): 35 min Charges:  OT General Charges $OT Visit: 1 Procedure OT Evaluation $OT Eval Moderate Complexity: 1 Procedure G-Codes:     [place], OTR/L [phone] 09/09/2016  Tammee Thielke 09/09/2016, 12:40 PM

## 2016-09-11 NOTE — Anesthesia Postprocedure Evaluation (Signed)
Anesthesia Post Note  Patient: Wayne Phelps  Procedure(s) Performed: Procedure(s) (LRB): COLONOSCOPY (N/A)     Patient location during evaluation: Endoscopy Anesthesia Type: MAC Level of consciousness: awake Pain management: pain level controlled Vital Signs Assessment: post-procedure vital signs reviewed and stable Respiratory status: spontaneous breathing Cardiovascular status: stable Postop Assessment: no signs of nausea or vomiting Anesthetic complications: no    Last Vitals:  Vitals:   09/09/16 1504 09/09/16 1751  BP: (!) 182/79 (!) 160/77  Pulse:  64  Resp:  20  Temp:  37.1 C    Last Pain:  Vitals:   09/09/16 1751  TempSrc: Oral  PainSc:    Pain Goal: Patients Stated Pain Goal: 0 (09/06/16 2229)               Perlita Forbush JR,JOHN Mateo Flow

## 2016-12-17 ENCOUNTER — Emergency Department (HOSPITAL_COMMUNITY)
Admission: EM | Admit: 2016-12-17 | Discharge: 2016-12-17 | Disposition: A | Discharge status: Home / Self Care | Payer: Managed Care, Other (non HMO) | Attending: Emergency Medicine | Admitting: Emergency Medicine

## 2016-12-17 ENCOUNTER — Encounter (HOSPITAL_COMMUNITY): Payer: Self-pay | Admitting: *Deleted

## 2016-12-17 ENCOUNTER — Emergency Department (HOSPITAL_COMMUNITY): Payer: Managed Care, Other (non HMO)

## 2016-12-17 DIAGNOSIS — J189 Pneumonia, unspecified organism: Secondary | ICD-10-CM | POA: Diagnosis not present

## 2016-12-17 DIAGNOSIS — Z7982 Long term (current) use of aspirin: Secondary | ICD-10-CM | POA: Diagnosis not present

## 2016-12-17 DIAGNOSIS — R0602 Shortness of breath: Secondary | ICD-10-CM | POA: Diagnosis present

## 2016-12-17 DIAGNOSIS — I252 Old myocardial infarction: Secondary | ICD-10-CM | POA: Diagnosis not present

## 2016-12-17 DIAGNOSIS — Z96612 Presence of left artificial shoulder joint: Secondary | ICD-10-CM | POA: Insufficient documentation

## 2016-12-17 DIAGNOSIS — I10 Essential (primary) hypertension: Secondary | ICD-10-CM | POA: Diagnosis not present

## 2016-12-17 DIAGNOSIS — J181 Lobar pneumonia, unspecified organism: Secondary | ICD-10-CM

## 2016-12-17 LAB — CBC WITH DIFFERENTIAL/PLATELET
BASOS ABS: 0 10*3/uL (ref 0.0–0.1)
Basophils Relative: 0 %
Eosinophils Absolute: 0.3 10*3/uL (ref 0.0–0.7)
Eosinophils Relative: 3 %
HCT: 37.8 % — ABNORMAL LOW (ref 39.0–52.0)
HEMOGLOBIN: 11.6 g/dL — AB (ref 13.0–17.0)
LYMPHS ABS: 1 10*3/uL (ref 0.7–4.0)
LYMPHS PCT: 11 %
MCH: 25.1 pg — AB (ref 26.0–34.0)
MCHC: 30.7 g/dL (ref 30.0–36.0)
MCV: 81.8 fL (ref 78.0–100.0)
Monocytes Absolute: 0.7 10*3/uL (ref 0.1–1.0)
Monocytes Relative: 8 %
NEUTROS PCT: 78 %
Neutro Abs: 6.8 10*3/uL (ref 1.7–7.7)
Platelets: 198 10*3/uL (ref 150–400)
RBC: 4.62 MIL/uL (ref 4.22–5.81)
RDW: 16.1 % — ABNORMAL HIGH (ref 11.5–15.5)
WBC: 8.7 10*3/uL (ref 4.0–10.5)

## 2016-12-17 LAB — BASIC METABOLIC PANEL
ANION GAP: 10 (ref 5–15)
BUN: 18 mg/dL (ref 6–20)
CHLORIDE: 106 mmol/L (ref 101–111)
CO2: 25 mmol/L (ref 22–32)
Calcium: 9.1 mg/dL (ref 8.9–10.3)
Creatinine, Ser: 0.96 mg/dL (ref 0.61–1.24)
GFR calc Af Amer: >60 mL/min (ref >60)
GFR calc non Af Amer: >60 mL/min (ref >60)
GLUCOSE: 110 mg/dL — AB (ref 65–99)
POTASSIUM: 3.4 mmol/L — AB (ref 3.5–5.1)
Sodium: 141 mmol/L (ref 135–145)

## 2016-12-17 MED ORDER — LEVOFLOXACIN 750 MG PO TABS: 750.0000 mg | ORAL_TABLET | Freq: Every day | ORAL | 0 refills | Status: AC

## 2016-12-17 MED ORDER — LEVOFLOXACIN IN D5W 750 MG/150ML IV SOLN
750.0000 mg | Freq: Once | INTRAVENOUS | Status: AC
  Administered 2016-12-17: 750 mg via INTRAVENOUS
  Filled 2016-12-17: qty 150

## 2016-12-17 NOTE — ED Notes (Signed)
Patient states he is currently rehab at [facility] after having a Cardiac Cath in April 2018 states his femoral artery was nicked and he had large bld loss. Was on vent for a while in ICU and spend several [facility] and is now rehab. At [facility] , states he is able to normally walk approx. 50 ft with walker twice a day however today was to weak to do PT. C/o cough and generalized bodyaches.

## 2016-12-17 NOTE — ED Provider Notes (Signed)
[place] DEPT Provider Note   CSN: [id number] Arrival date & time: 12/17/16  0231     History   Chief Complaint Chief Complaint  Patient presents with  . Shortness of Breath    HPI KAREN KINNARD is a 61 y.o. male.  Patient presents to the emergency department for evaluation of upper respiratory infection symptoms. Patient reports that he started with a sore throat yesterday, but today developed a cough. He reports the cough has been productive of a thick sputum. He feels short of breath. He has not had a fever. No chest pain. Patient denies nausea, vomiting, diarrhea.      Past Medical History:  Diagnosis Date  . Arthritis   . Hypertension   . Myocardial infarction [facility]) 06/2016    Patient Active Problem List   Diagnosis Date Noted  . Benign neoplasm of sigmoid colon   . Hemorrhoids   . Diverticulosis of colon without hemorrhage   . Heme + stool   . Decreased hemoglobin   . Melena   . Rectal bleeding 09/03/2016  . Hx of non-ST elevation myocardial infarction (NSTEMI) 09/03/2016  . Urinary retention 09/03/2016    Past Surgical History:  Procedure Laterality Date  . CARDIAC CATHETERIZATION  06/11/2016   stent placed, nicked femoral artery  . COLONOSCOPY N/A 09/08/2016   Procedure: COLONOSCOPY;  Surgeon: Milus Banister, MD;  Location: [facility] ENDOSCOPY;  Service: Endoscopy;  Laterality: N/A;  . ESOPHAGOGASTRODUODENOSCOPY N/A 09/05/2016   Procedure: ESOPHAGOGASTRODUODENOSCOPY (EGD);  Surgeon: Irene Shipper, MD;  Location: [facility] ENDOSCOPY;  Service: Endoscopy;  Laterality: N/A;  . JOINT REPLACEMENT Left 1980s   total shoulder replacement       Home Medications    Prior to Admission medications   Medication Sig Start Date End Date Taking? Authorizing Provider  acetaminophen (TYLENOL) 325 MG tablet Take 650 mg by mouth every 6 (six) hours as needed (for pain).   Yes [provider]  amiodarone (PACERONE) 100 MG tablet Take 100 mg by mouth daily.    Yes [provider]  aspirin EC 81 MG tablet Take 81 mg by mouth daily.   Yes [provider]  atorvastatin (LIPITOR) 40 MG tablet Take 40 mg by mouth at bedtime.   Yes [provider]  hydrALAZINE (APRESOLINE) 50 MG tablet Take 1 tablet (50 mg total) by mouth every 8 (eight) hours. 09/09/16  Yes Rice, Resa Miner, MD  HYDROcodone-acetaminophen (NORCO/VICODIN) 5-325 MG tablet Take 1 tablet by mouth every 8 (eight) hours as needed (for pain).   Yes [provider]  metoprolol tartrate (LOPRESSOR) 25 MG tablet Take 25 mg by mouth 2 (two) times daily.   Yes [provider]  nutrition supplement, JUVEN, (JUVEN) PACK Take 1 packet by mouth 2 (two) times daily between meals.   Yes [provider]  pantoprazole (PROTONIX) 40 MG tablet Take 1 tablet (40 mg total) by mouth daily. 09/10/16  Yes Rice, Resa Miner, MD  phenol (CHLORASEPTIC) 1.4 % LIQD Use as directed 1 spray in the mouth or throat as needed for throat irritation / pain.   Yes [provider]  tamsulosin (FLOMAX) 0.4 MG CAPS capsule Take 0.4 mg by mouth daily.   Yes [provider]  ticagrelor (BRILINTA) 90 MG TABS tablet Take 90 mg by mouth 2 (two) times daily.   Yes [provider]  levofloxacin (LEVAQUIN) 750 MG tablet Take 1 tablet (750 mg total) by mouth daily. 12/17/16   Orpah Greek, MD  Family History No family history on file.  Social History Social History  Substance Use Topics  . Smoking status: Never Smoker  . Smokeless tobacco: Never Used  . Alcohol use No     Allergies   Patient has no known allergies.   Review of Systems Review of Systems  HENT: Positive for sore throat.   Respiratory: Positive for cough and shortness of breath.   All other systems reviewed and are negative.    Physical Exam Updated Vital Signs BP (!) 149/63   Pulse 73   Temp 98.2 F (36.8 C) (Oral)   Resp (!) 21   SpO2 95%   Physical Exam    Constitutional: He is oriented to person, place, and time. He appears well-developed and well-nourished. No distress.  HENT:  Head: Normocephalic and atraumatic.  Right Ear: Hearing normal.  Left Ear: Hearing normal.  Nose: Nose normal.  Mouth/Throat: Oropharynx is clear and moist and mucous membranes are normal.  Eyes: Pupils are equal, round, and reactive to light. Conjunctivae and EOM are normal.  Neck: Normal range of motion. Neck supple.  Cardiovascular: Regular rhythm, S1 normal and S2 normal.  Exam reveals no gallop and no friction rub.   No murmur heard. Pulmonary/Chest: Effort normal and breath sounds normal. No respiratory distress. He exhibits no tenderness.  Abdominal: Soft. Normal appearance and bowel sounds are normal. There is no hepatosplenomegaly. There is no tenderness. There is no rebound, no guarding, no tenderness at McBurney's point and negative Murphy's sign. No hernia.  Musculoskeletal: Normal range of motion.  Neurological: He is alert and oriented to person, place, and time. He has normal strength. No cranial nerve deficit or sensory deficit. Coordination normal. GCS eye subscore is 4. GCS verbal subscore is 5. GCS motor subscore is 6.  Skin: Skin is warm, dry and intact. No rash noted. No cyanosis.  Psychiatric: He has a normal mood and affect. His speech is normal and behavior is normal. Thought content normal.  Nursing note and vitals reviewed.    ED Treatments / Results  Labs (all labs ordered are listed, but only abnormal results are displayed) Labs Reviewed  CBC WITH DIFFERENTIAL/PLATELET - Abnormal; Notable for the following:       Result Value   Hemoglobin 11.6 (*)    HCT 37.8 (*)    MCH 25.1 (*)    RDW 16.1 (*)    All other components within normal limits  BASIC METABOLIC PANEL - Abnormal; Notable for the following:    Potassium 3.4 (*)    Glucose, Bld 110 (*)    All other components within normal limits    EKG  EKG  Interpretation  Date/Time:  Wednesday December 17 2016 02:44:07 EDT Ventricular Rate:  70 PR Interval:    QRS Duration: 108 QT Interval:  430 QTC Calculation: 464 R Axis:   64 Text Interpretation:  Sinus rhythm Normal ECG Confirmed by Orpah Greek [phone]) on 12/17/2016 2:47:36 AM       Radiology Dg Chest 2 View  Result Date: 12/17/2016 CLINICAL DATA:  Cough and dyspnea for 1 day EXAM: CHEST  2 VIEW COMPARISON:  08/05/2016 . FINDINGS: Patchy right base opacity and probable right pleural effusion. Left lung is clear. Pulmonary vasculature is normal. Unchanged moderate cardiomegaly. IMPRESSION: Probable right effusion and patchy right base opacity. Cannot exclude pneumonia. Stable cardiomegaly. Recommend follow-up radiography to resolution. Electronically Signed   By: Andreas Newport M.D.   On: 12/17/2016 03:58    Procedures Procedures (  including critical care time)  Medications Ordered in ED Medications  levofloxacin (LEVAQUIN) IVPB 750 mg (not administered)     Initial Impression / Assessment and Plan / ED Course  I have reviewed the triage vital signs and the nursing notes.  Pertinent labs & imaging results that were available during my care of the patient were reviewed by me and considered in my medical decision making (see chart for details).     Patient presents to the emergency department for evaluation of cough and chest congestion. Patient is currently in a nursing home for rehabilitation. Today he noticed that he was extremely weak and he has been experiencing cough and chest congestion. He feels short of breath. He was unable to perform his therapy yesterday because of these symptoms.  Patient's workup is suspicious for pneumonia. He is not experiencing chest pain or having any cardiac issues. Patient will be initiated on Levaquin and is appropriate for return to rehabilitation as he is breathing comfortably with no hypoxia.  Final Clinical Impressions(s) /  ED Diagnoses   Final diagnoses:  Community acquired pneumonia of right lower lobe of lung ([place])  HCAP (healthcare-associated pneumonia)    New Prescriptions New Prescriptions   LEVOFLOXACIN (LEVAQUIN) 750 MG TABLET    Take 1 tablet (750 mg total) by mouth daily.     Orpah Greek, MD 12/17/16 [phone]

## 2016-12-17 NOTE — ED Triage Notes (Signed)
Pt to ED from [facility] for sob onset today.  Reports nonproductive cough with sore throat. Pt is currently in rehab for complications from surgery in June and does physical therapy. Was unable to do physical therapy today due to not feeling well. Pt warm to the touch

## 2017-12-26 IMAGING — CR DG CHEST 2V
2 series · 2 of 2 positions shown · non-contrast
Comparison: 08/05/2016 .

CLINICAL DATA: Cough and dyspnea for 1 day

EXAM:
CHEST  2 VIEW

[chest lat]
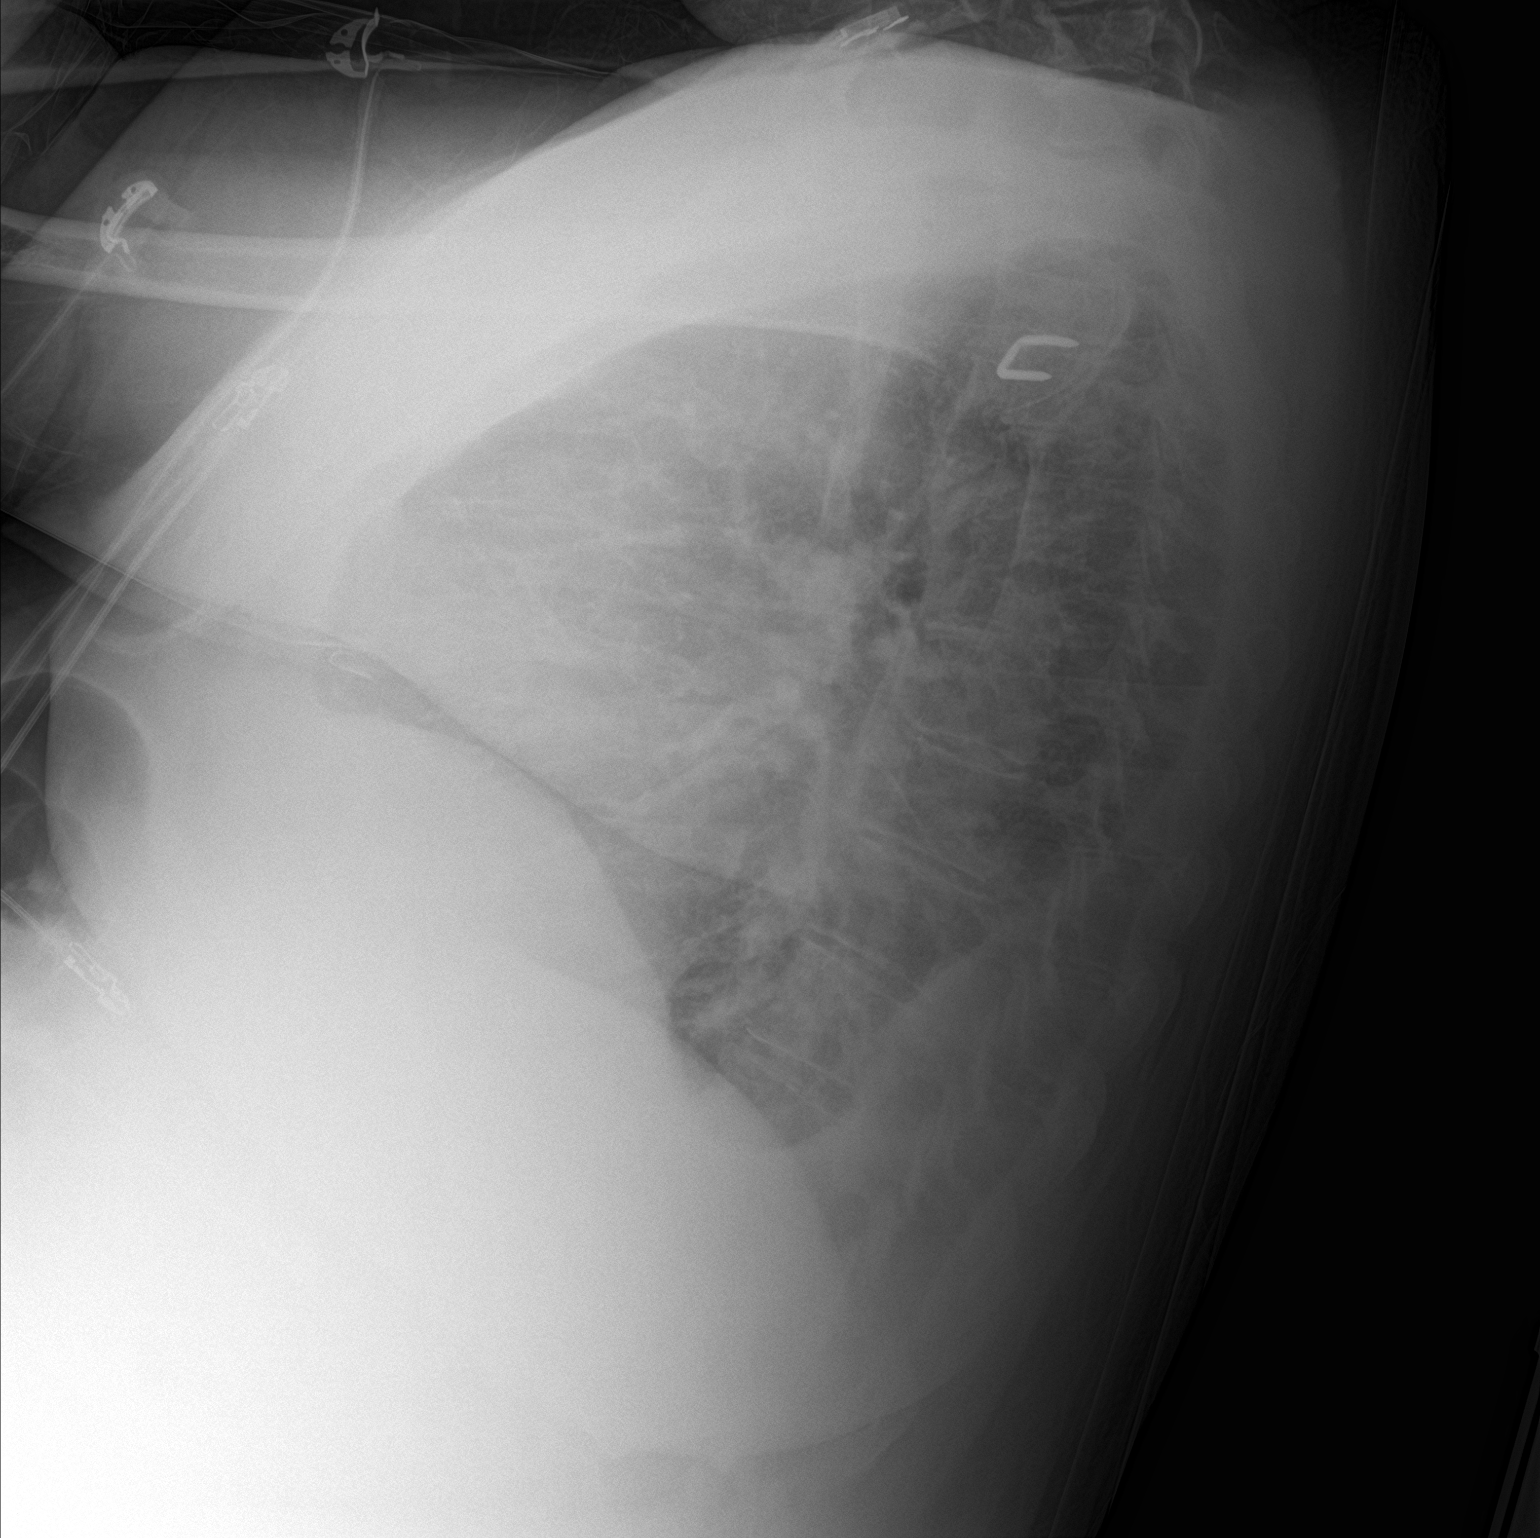

[chest ap]
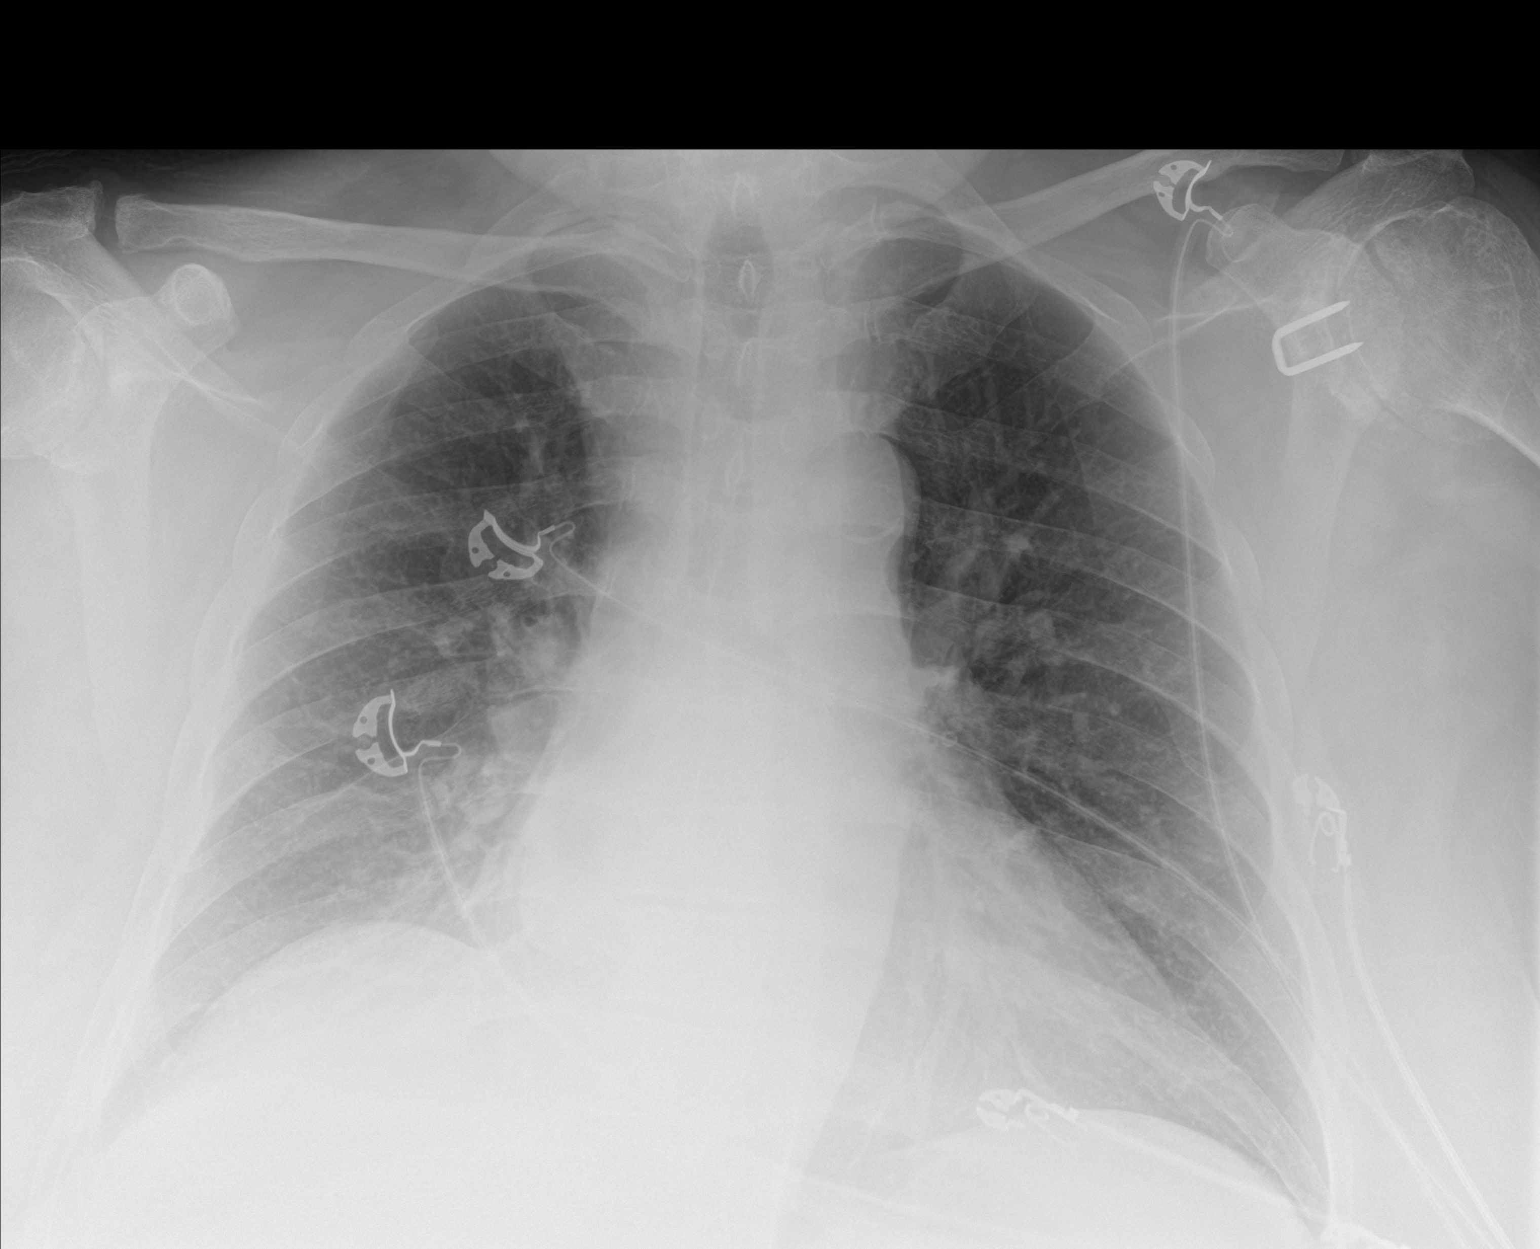

[2 of 2 positions shown; findings below may reference images not displayed]

FINDINGS: Patchy right base opacity and probable right pleural effusion. Left
lung is clear. Pulmonary vasculature is normal. Unchanged moderate
cardiomegaly.
IMPRESSION: Probable right effusion and patchy right base opacity. Cannot
exclude pneumonia. Stable cardiomegaly. Recommend follow-up
radiography to resolution.

## 2018-02-07 DEATH — deceased

## 2021-09-26 ENCOUNTER — Encounter: Payer: Self-pay | Admitting: Internal Medicine
